# Patient Record
Sex: Female | Born: 1995 | Race: White | Hispanic: No | Marital: Single | Smoking: Never smoker
Health system: Southern US, Community
[De-identification: ages and names within clinical notes are randomized; demographics above are authoritative.]

## PROBLEM LIST (undated history)

## (undated) DIAGNOSIS — F99 Mental disorder, not otherwise specified: Secondary | ICD-10-CM

## (undated) DIAGNOSIS — F419 Anxiety disorder, unspecified: Secondary | ICD-10-CM

## (undated) DIAGNOSIS — I10 Essential (primary) hypertension: Secondary | ICD-10-CM

## (undated) HISTORY — DX: Mental disorder, not otherwise specified: F99

## (undated) HISTORY — DX: Essential (primary) hypertension: I10

## (undated) HISTORY — DX: Anxiety disorder, unspecified: F41.9

## (undated) HISTORY — PX: DENTAL SURGERY: SHX609

---

## 2017-05-27 ENCOUNTER — Encounter: Payer: Self-pay | Admitting: Adult Health

## 2017-05-27 ENCOUNTER — Ambulatory Visit (INDEPENDENT_AMBULATORY_CARE_PROVIDER_SITE_OTHER): Payer: 59 | Admitting: Adult Health

## 2017-05-27 VITALS — BP 120/60 | HR 98 | Ht 64.0 in | Wt 217.0 lb

## 2017-05-27 DIAGNOSIS — F419 Anxiety disorder, unspecified: Secondary | ICD-10-CM

## 2017-05-27 DIAGNOSIS — F418 Other specified anxiety disorders: Secondary | ICD-10-CM | POA: Diagnosis not present

## 2017-05-27 DIAGNOSIS — N926 Irregular menstruation, unspecified: Secondary | ICD-10-CM

## 2017-05-27 DIAGNOSIS — F329 Major depressive disorder, single episode, unspecified: Secondary | ICD-10-CM

## 2017-05-27 DIAGNOSIS — O3680X0 Pregnancy with inconclusive fetal viability, not applicable or unspecified: Secondary | ICD-10-CM | POA: Insufficient documentation

## 2017-05-27 DIAGNOSIS — Z3201 Encounter for pregnancy test, result positive: Secondary | ICD-10-CM | POA: Insufficient documentation

## 2017-05-27 DIAGNOSIS — R350 Frequency of micturition: Secondary | ICD-10-CM | POA: Diagnosis not present

## 2017-05-27 DIAGNOSIS — Z3A01 Less than 8 weeks gestation of pregnancy: Secondary | ICD-10-CM

## 2017-05-27 DIAGNOSIS — F32A Depression, unspecified: Secondary | ICD-10-CM | POA: Insufficient documentation

## 2017-05-27 LAB — POCT URINE PREGNANCY: PREG TEST UR: POSITIVE — AB

## 2017-05-27 MED ORDER — SERTRALINE HCL 100 MG PO TABS: ORAL_TABLET | ORAL | 6 refills | Status: DC

## 2017-05-27 MED ORDER — PRENATAL PLUS IRON 29-1 MG PO TABS: 1.0000 | ORAL_TABLET | Freq: Every day | ORAL | 12 refills | Status: DC

## 2017-05-27 NOTE — Patient Instructions (Signed)
First Trimester of Pregnancy The first trimester of pregnancy is from week 1 until the end of week 13 (months 1 through 3). A week after a sperm fertilizes an egg, the egg will implant on the wall of the uterus. This embryo will begin to develop into a baby. Genes from you and your partner will form the baby. The female genes will determine whether the baby will be a boy or a girl. At 6-8 weeks, the eyes and face will be formed, and the heartbeat can be seen on ultrasound. At the end of 12 weeks, all the baby's organs will be formed. Now that you are pregnant, you will want to do everything you can to have a healthy baby. Two of the most important things are to get good prenatal care and to follow your health care provider's instructions. Prenatal care is all the medical care you receive before the baby's birth. This care will help prevent, find, and treat any problems during the pregnancy and childbirth. Body changes during your first trimester Your body goes through many changes during pregnancy. The changes vary from woman to woman.  You may gain or lose a couple of pounds at first.  You may feel sick to your stomach (nauseous) and you may throw up (vomit). If the vomiting is uncontrollable, call your health care provider.  You may tire easily.  You may develop headaches that can be relieved by medicines. All medicines should be approved by your health care provider.  You may urinate more often. Painful urination may mean you have a bladder infection.  You may develop heartburn as a result of your pregnancy.  You may develop constipation because certain hormones are causing the muscles that push stool through your intestines to slow down.  You may develop hemorrhoids or swollen veins (varicose veins).  Your breasts may begin to grow larger and become tender. Your nipples may stick out more, and the tissue that surrounds them (areola) may become darker.  Your gums may bleed and may be  sensitive to brushing and flossing.  Dark spots or blotches (chloasma, mask of pregnancy) may develop on your face. This will likely fade after the baby is born.  Your menstrual periods will stop.  You may have a loss of appetite.  You may develop cravings for certain kinds of food.  You may have changes in your emotions from day to day, such as being excited to be pregnant or being concerned that something may go wrong with the pregnancy and baby.  You may have more vivid and strange dreams.  You may have changes in your hair. These can include thickening of your hair, rapid growth, and changes in texture. Some women also have hair loss during or after pregnancy, or hair that feels dry or thin. Your hair will most likely return to normal after your baby is born.  What to expect at prenatal visits During a routine prenatal visit:  You will be weighed to make sure you and the baby are growing normally.  Your blood pressure will be taken.  Your abdomen will be measured to track your baby's growth.  The fetal heartbeat will be listened to between weeks 10 and 14 of your pregnancy.  Test results from any previous visits will be discussed.  Your health care provider may ask you:  How you are feeling.  If you are feeling the baby move.  If you have had any abnormal symptoms, such as leaking fluid, bleeding, severe headaches,   or abdominal cramping.  If you are using any tobacco products, including cigarettes, chewing tobacco, and electronic cigarettes.  If you have any questions.  Other tests that may be performed during your first trimester include:  Blood tests to find your blood type and to check for the presence of any previous infections. The tests will also be used to check for low iron levels (anemia) and protein on red blood cells (Rh antibodies). Depending on your risk factors, or if you previously had diabetes during pregnancy, you may have tests to check for high blood  sugar that affects pregnant women (gestational diabetes).  Urine tests to check for infections, diabetes, or protein in the urine.  An ultrasound to confirm the proper growth and development of the baby.  Fetal screens for spinal cord problems (spina bifida) and Down syndrome.  HIV (human immunodeficiency virus) testing. Routine prenatal testing includes screening for HIV, unless you choose not to have this test.  You may need other tests to make sure you and the baby are doing well.  Follow these instructions at home: Medicines  Follow your health care provider's instructions regarding medicine use. Specific medicines may be either safe or unsafe to take during pregnancy.  Take a prenatal vitamin that contains at least 600 micrograms (mcg) of folic acid.  If you develop constipation, try taking a stool softener if your health care provider approves. Eating and drinking  Eat a balanced diet that includes fresh fruits and vegetables, whole grains, good sources of protein such as meat, eggs, or tofu, and low-fat dairy. Your health care provider will help you determine the amount of weight gain that is right for you.  Avoid raw meat and uncooked cheese. These carry germs that can cause birth defects in the baby.  Eating four or five small meals rather than three large meals a day may help relieve nausea and vomiting. If you start to feel nauseous, eating a few soda crackers can be helpful. Drinking liquids between meals, instead of during meals, also seems to help ease nausea and vomiting.  Limit foods that are high in fat and processed sugars, such as fried and sweet foods.  To prevent constipation: ? Eat foods that are high in fiber, such as fresh fruits and vegetables, whole grains, and beans. ? Drink enough fluid to keep your urine clear or pale yellow. Activity  Exercise only as directed by your health care provider. Most women can continue their usual exercise routine during  pregnancy. Try to exercise for 30 minutes at least 5 days a week. Exercising will help you: ? Control your weight. ? Stay in shape. ? Be prepared for labor and delivery.  Experiencing pain or cramping in the lower abdomen or lower back is a good sign that you should stop exercising. Check with your health care provider before continuing with normal exercises.  Try to avoid standing for long periods of time. Move your legs often if you must stand in one place for a long time.  Avoid heavy lifting.  Wear low-heeled shoes and practice good posture.  You may continue to have sex unless your health care provider tells you not to. Relieving pain and discomfort  Wear a good support bra to relieve breast tenderness.  Take warm sitz baths to soothe any pain or discomfort caused by hemorrhoids. Use hemorrhoid cream if your health care provider approves.  Rest with your legs elevated if you have leg cramps or low back pain.  If you develop   varicose veins in your legs, wear support hose. Elevate your feet for 15 minutes, 3-4 times a day. Limit salt in your diet. Prenatal care  Schedule your prenatal visits by the twelfth week of pregnancy. They are usually scheduled monthly at first, then more often in the last 2 months before delivery.  Write down your questions. Take them to your prenatal visits.  Keep all your prenatal visits as told by your health care provider. This is important. Safety  Wear your seat belt at all times when driving.  Make a list of emergency phone numbers, including numbers for family, friends, the hospital, and police and fire departments. General instructions  Ask your health care provider for a referral to a local prenatal education class. Begin classes no later than the beginning of month 6 of your pregnancy.  Ask for help if you have counseling or nutritional needs during pregnancy. Your health care provider can offer advice or refer you to specialists for help  with various needs.  Do not use hot tubs, steam rooms, or saunas.  Do not douche or use tampons or scented sanitary pads.  Do not cross your legs for long periods of time.  Avoid cat litter boxes and soil used by cats. These carry germs that can cause birth defects in the baby and possibly loss of the fetus by miscarriage or stillbirth.  Avoid all smoking, herbs, alcohol, and medicines not prescribed by your health care provider. Chemicals in these products affect the formation and growth of the baby.  Do not use any products that contain nicotine or tobacco, such as cigarettes and e-cigarettes. If you need help quitting, ask your health care provider. You may receive counseling support and other resources to help you quit.  Schedule a dentist appointment. At home, brush your teeth with a soft toothbrush and be gentle when you floss. Contact a health care provider if:  You have dizziness.  You have mild pelvic cramps, pelvic pressure, or nagging pain in the abdominal area.  You have persistent nausea, vomiting, or diarrhea.  You have a bad smelling vaginal discharge.  You have pain when you urinate.  You notice increased swelling in your face, hands, legs, or ankles.  You are exposed to fifth disease or chickenpox.  You are exposed to German measles (rubella) and have never had it. Get help right away if:  You have a fever.  You are leaking fluid from your vagina.  You have spotting or bleeding from your vagina.  You have severe abdominal cramping or pain.  You have rapid weight gain or loss.  You vomit blood or material that looks like coffee grounds.  You develop a severe headache.  You have shortness of breath.  You have any kind of trauma, such as from a fall or a car accident. Summary  The first trimester of pregnancy is from week 1 until the end of week 13 (months 1 through 3).  Your body goes through many changes during pregnancy. The changes vary from  woman to woman.  You will have routine prenatal visits. During those visits, your health care provider will examine you, discuss any test results you may have, and talk with you about how you are feeling. This information is not intended to replace advice given to you by your health care provider. Make sure you discuss any questions you have with your health care provider. Document Released: 01/14/2001 Document Revised: 01/02/2016 Document Reviewed: 01/02/2016 Elsevier Interactive Patient Education  2018 Elsevier   Inc.  

## 2017-05-27 NOTE — Progress Notes (Signed)
Subjective:     Patient ID: Sara Morse, female   DOB: 12/06/1995, 22 y.o.   MRN: [id number]  HPI Sara Morse is a 22 year old white female in for UPT, has missed a period and had +HPT.She has anxiety and depression and has been off meds for about a month.  PCP is [facility] in [place].   Review of Systems +missed period, +HPT +frequenct urination  +moody  Reviewed past medical,surgical, social and family history. Reviewed medications and allergies.     Objective:   Physical Exam BP 120/60 (BP Location: Left Arm, Patient Position: Sitting, Cuff Size: Small)   Pulse 98   Ht 5\' 4"  (1.626 m)   Wt 217 lb (98.4 kg)   LMP 04/16/2017   BMI 37.25 kg/m UPT +, about 5+6 weeks by LMP with EDD 01/21/18.Skin warm and dry. Neck: mid line trachea, normal thyroid, good ROM, no lymphadenopathy noted. Lungs: clear to ausculation bilaterally. Cardiovascular: regular rate and rhythm.Abdomen is soft and non tender.PHQ 9 score is 10, has been in zoloft but stopped about a month ago, will resume.  [facility] and about after hours call service.    Assessment:     1. Pregnancy test positive   2. Less than [redacted] weeks gestation of pregnancy   3. Encounter to determine fetal viability of pregnancy, single or unspecified fetus   4. Anxiety and depression       Plan:    Resume zoloft at 50 mg for now Meds ordered this encounter  Medications  . sertraline (ZOLOFT) 100 MG tablet    Sig: Take 1/2 tablet daily    Dispense:  15 tablet    Refill:  6    Order Specific Question:   Supervising Provider    Answer:   Despina HiddenEURE, LUTHER H [[id number]]  . Prenatal Vit-Iron Carbonyl-FA (PRENATAL PLUS IRON) 29-1 MG TABS    Sig: Take 1 tablet by mouth daily.    Dispense:  30 tablet    Refill:  12    Order Specific Question:   Supervising Provider    Answer:   Lazaro ArmsEURE, LUTHER H [[id number]]  Return in 1 week for dating US Review handouts on First trimester and by [facility]  Let boyfriend handle kitty litter  (has 3 cats)

## 2017-06-03 ENCOUNTER — Other Ambulatory Visit: Payer: 59

## 2017-06-03 ENCOUNTER — Ambulatory Visit (INDEPENDENT_AMBULATORY_CARE_PROVIDER_SITE_OTHER): Payer: 59

## 2017-06-03 ENCOUNTER — Other Ambulatory Visit: Payer: Self-pay | Admitting: Adult Health

## 2017-06-03 DIAGNOSIS — Z3A01 Less than 8 weeks gestation of pregnancy: Secondary | ICD-10-CM | POA: Diagnosis not present

## 2017-06-03 DIAGNOSIS — O3680X0 Pregnancy with inconclusive fetal viability, not applicable or unspecified: Secondary | ICD-10-CM | POA: Diagnosis not present

## 2017-06-03 DIAGNOSIS — Z3490 Encounter for supervision of normal pregnancy, unspecified, unspecified trimester: Secondary | ICD-10-CM

## 2017-06-03 NOTE — Progress Notes (Signed)
[place] TA/TV:5+4 wks GS,no fetal pole or YS,normal ovaries bilat,pt will have labs today and f/u ultrasound in 10 days,per Victorino Dike

## 2017-06-04 ENCOUNTER — Telehealth: Payer: Self-pay | Admitting: Adult Health

## 2017-06-04 LAB — BETA HCG QUANT (REF LAB): HCG QUANT: 22955 m[IU]/mL

## 2017-06-04 LAB — PROGESTERONE: Progesterone: 4.4 ng/mL

## 2017-06-04 MED ORDER — PROGESTERONE MICRONIZED 200 MG PO CAPS: ORAL_CAPSULE | ORAL | 3 refills | Status: DC

## 2017-06-04 NOTE — Telephone Encounter (Signed)
Pt aware of labs and that progesterone low, will supplement with 200 mg Prometrium at hs daily and keep [place] appt.

## 2017-06-07 ENCOUNTER — Encounter: Payer: Self-pay | Admitting: Adult Health

## 2017-06-15 ENCOUNTER — Other Ambulatory Visit: Payer: Self-pay | Admitting: Adult Health

## 2017-06-15 DIAGNOSIS — O3680X0 Pregnancy with inconclusive fetal viability, not applicable or unspecified: Secondary | ICD-10-CM

## 2017-06-16 ENCOUNTER — Other Ambulatory Visit: Payer: Self-pay | Admitting: Obstetrics and Gynecology

## 2017-06-16 ENCOUNTER — Ambulatory Visit (INDEPENDENT_AMBULATORY_CARE_PROVIDER_SITE_OTHER): Payer: 59 | Admitting: Obstetrics and Gynecology

## 2017-06-16 ENCOUNTER — Ambulatory Visit (INDEPENDENT_AMBULATORY_CARE_PROVIDER_SITE_OTHER): Payer: 59

## 2017-06-16 ENCOUNTER — Other Ambulatory Visit: Payer: Self-pay | Admitting: Adult Health

## 2017-06-16 DIAGNOSIS — Z3A08 8 weeks gestation of pregnancy: Secondary | ICD-10-CM | POA: Diagnosis not present

## 2017-06-16 DIAGNOSIS — O3680X0 Pregnancy with inconclusive fetal viability, not applicable or unspecified: Secondary | ICD-10-CM

## 2017-06-16 DIAGNOSIS — O021 Missed abortion: Secondary | ICD-10-CM | POA: Diagnosis not present

## 2017-06-16 NOTE — Progress Notes (Signed)
   Family [facility] Visit  @            Patient name: Sara Morse MRN [id number]  Date of birth: 1995/06/22  CC & HPI:  Sara Morse is a 22 y.o. female presenting today for follow-up of today's ultrasound which shows a missed AB she has a 1.8 cm gestational sac with a large area of subchorionic hemorrhage surrounding the sac on image 4.  There is a small hint of a yolk sac but no fetal pole.  She had a previous ultrasound 2 weeks ago which showed a small gestational sac.  There is been an appropriate growth to date.   ROS:  ROS No spotting or bleeding.  I reviewed the ultrasound photos with the patient and her husband Chasse in detail  Pertinent History Reviewed:   Reviewed: Significant for patient has no questions despite efforts to connect and communicate Medical         Past Medical History:  Diagnosis Date  . Anxiety   . Mental disorder                               Surgical Hx:    Past Surgical History:  Procedure Laterality Date  . DENTAL SURGERY     Medications: Reviewed & Updated - see associated section                       Current Outpatient Medications:  .  hydrOXYzine (ATARAX/VISTARIL) 25 MG tablet, Take 25 mg by mouth 3 (three) times daily as needed., Disp: , Rfl:  .  Prenatal Vit-Iron Carbonyl-FA (PRENATAL PLUS IRON) 29-1 MG TABS, Take 1 tablet by mouth daily., Disp: 30 tablet, Rfl: 12 .  progesterone (PROMETRIUM) 200 MG capsule, Place 1 in vagina daily at bedtime, Disp: 30 capsule, Rfl: 3 .  sertraline (ZOLOFT) 100 MG tablet, Take 1/2 tablet daily, Disp: 15 tablet, Rfl: 6   Social History: Reviewed -  reports that she has never smoked. She has never used smokeless tobacco.  Objective Findings:  Vitals: Last menstrual period 04/16/2017.  PHYSICAL EXAMINATION General appearance - alert, well appearing, and in no distress, oriented to person, place, and time and overweight Mental status - alert, oriented to person, place, and time, normal mood,  behavior, speech, dress, motor activity, and thought processes    Assessment & Plan:   A:  1. Missed AB  P:  1. Patient declines option of Cytotec at this point.  She simply wants to follow.  Will arrange follow-up visit in 2 weeks.  Appointment with nurse practitioner or nurse midwife to be made

## 2017-06-16 NOTE — Progress Notes (Signed)
US TV:6+2 wks GS w/ys,GS 15.8 mm,no fetal pole visualized,normal ovaries bilat,small subchorionic hemorrhage 7 x 9 x 3 mm,Dr Emelda Fear discussed results w/pt.

## 2017-06-17 ENCOUNTER — Other Ambulatory Visit: Payer: Self-pay | Admitting: Obstetrics and Gynecology

## 2017-06-17 ENCOUNTER — Telehealth: Payer: Self-pay | Admitting: Obstetrics and Gynecology

## 2017-06-17 LAB — ABO/RH: Rh Factor: POSITIVE

## 2017-06-17 MED ORDER — MISOPROSTOL 200 MCG PO TABS: 800.0000 ug | ORAL_TABLET | Freq: Once | ORAL | 1 refills | Status: DC

## 2017-06-17 NOTE — Telephone Encounter (Signed)
Patient informed cytotec was sent to pharmacy.  Instructed to take vaginally today and repeat in 48 hours if not bleeding or cramping.  Patient verbalized understanding.

## 2017-06-17 NOTE — Progress Notes (Signed)
Cytotec 800 mcg po Rx'd to pt with one refil for missed ab.

## 2017-06-17 NOTE — Telephone Encounter (Signed)
Patient called stating that she seen Dr. Emelda Fear yesterday and told her he could prescribe her something to hurry along the miscarriage. Pt states that she told him no yesterday but has changed her mind and would like for him to send it in to [facility] in [place]. Please contact pt

## 2017-06-22 ENCOUNTER — Ambulatory Visit: Payer: 59 | Admitting: Women's Health

## 2017-07-01 ENCOUNTER — Ambulatory Visit: Payer: 59 | Admitting: Women's Health

## 2017-07-13 ENCOUNTER — Ambulatory Visit: Payer: 59 | Admitting: Women's Health

## 2017-07-15 ENCOUNTER — Encounter: Payer: Self-pay | Admitting: Women's Health

## 2017-07-15 ENCOUNTER — Ambulatory Visit (INDEPENDENT_AMBULATORY_CARE_PROVIDER_SITE_OTHER): Payer: 59 | Admitting: Women's Health

## 2017-07-15 VITALS — BP 131/74 | HR 96 | Ht 64.0 in | Wt 221.0 lb

## 2017-07-15 DIAGNOSIS — Z319 Encounter for procreative management, unspecified: Secondary | ICD-10-CM

## 2017-07-15 DIAGNOSIS — Z3202 Encounter for pregnancy test, result negative: Secondary | ICD-10-CM

## 2017-07-15 DIAGNOSIS — O039 Complete or unspecified spontaneous abortion without complication: Secondary | ICD-10-CM | POA: Diagnosis not present

## 2017-07-15 DIAGNOSIS — O021 Missed abortion: Secondary | ICD-10-CM

## 2017-07-15 DIAGNOSIS — Z3169 Encounter for other general counseling and advice on procreation: Secondary | ICD-10-CM

## 2017-07-15 LAB — POCT URINE PREGNANCY: PREG TEST UR: NEGATIVE

## 2017-07-15 NOTE — Progress Notes (Signed)
   GYN VISIT Patient name: Sara Morse MRN [id number]  Date of birth: 09-Feb-1995 Chief Complaint:   Follow-up (Missed AB)  History of Present Illness:   Sara HuaSarah Comp is a 22 y.o. [id number] Caucasian female being seen today for f/u missed Ab dx 06/16/17. Took cytotec and passed sac w/in hours. Bleeding/cramping have completely resolved. HPT she took now neg. Does desire another pregnancy. Taking pnv. Has had unprotected sex since miscarriage.      Patient's last menstrual period was 04/16/2017. The current method of family planning is none. Last pap 2018 at PCP. Results were:  normal Review of Systems:   Pertinent items are noted in HPI Denies fever/chills, dizziness, headaches, visual disturbances, fatigue, shortness of breath, chest pain, abdominal pain, vomiting, abnormal vaginal discharge/itching/odor/irritation, problems with periods, bowel movements, urination, or intercourse unless otherwise stated above.  Pertinent History Reviewed:  Reviewed past medical,surgical, social, obstetrical and family history.  Reviewed problem list, medications and allergies. Physical Assessment:   Vitals:   07/15/17 1559  BP: 131/74  Pulse: 96  Weight: 221 lb (100.2 kg)  Height: 5\' 4"  (1.626 m)  Body mass index is 37.93 kg/m.       Physical Examination:   General appearance: alert, well appearing, and in no distress  Mental status: alert, oriented to person, place, and time  Skin: warm & dry   Cardiovascular: normal heart rate noted  Respiratory: normal respiratory effort, no distress  Abdomen: soft, non-tender   Pelvic: examination not indicated  Extremities: no edema   Results for orders placed or performed in visit on 07/15/17 (from the past 24 hour(s))  POCT urine pregnancy   Collection Time: 07/15/17  4:08 PM  Result Value Ref Range   Preg Test, Ur Negative Negative    Assessment & Plan:  1) Completed miscarriage> will check hcg today  2) Desires pregnancy> continue pnv, let us  know when pregnant  Meds: No orders of the defined types were placed in this encounter.   Orders Placed This Encounter  Procedures  . Beta hCG quant (ref lab)  . POCT urine pregnancy    Return for prn.  Cheral MarkerKimberly R Ren Aspinall CNM, [facility]NP-BC 07/15/2017 4:22 PM

## 2017-07-16 ENCOUNTER — Other Ambulatory Visit: Payer: Self-pay | Admitting: Women's Health

## 2017-07-16 LAB — BETA HCG QUANT (REF LAB): hCG Quant: 6 m[IU]/mL

## 2018-01-13 ENCOUNTER — Ambulatory Visit: Payer: 59 | Admitting: Adult Health

## 2018-02-01 ENCOUNTER — Encounter: Payer: Self-pay | Admitting: Adult Health

## 2018-02-01 ENCOUNTER — Ambulatory Visit (INDEPENDENT_AMBULATORY_CARE_PROVIDER_SITE_OTHER): Payer: 59 | Admitting: Adult Health

## 2018-02-01 ENCOUNTER — Other Ambulatory Visit: Payer: Self-pay

## 2018-02-01 VITALS — BP 149/87 | HR 113 | Ht 64.0 in | Wt 228.0 lb

## 2018-02-01 DIAGNOSIS — N926 Irregular menstruation, unspecified: Secondary | ICD-10-CM | POA: Diagnosis not present

## 2018-02-01 DIAGNOSIS — Z3A09 9 weeks gestation of pregnancy: Secondary | ICD-10-CM

## 2018-02-01 DIAGNOSIS — Z3201 Encounter for pregnancy test, result positive: Secondary | ICD-10-CM | POA: Diagnosis not present

## 2018-02-01 DIAGNOSIS — R112 Nausea with vomiting, unspecified: Secondary | ICD-10-CM | POA: Diagnosis not present

## 2018-02-01 DIAGNOSIS — O3680X0 Pregnancy with inconclusive fetal viability, not applicable or unspecified: Secondary | ICD-10-CM | POA: Insufficient documentation

## 2018-02-01 DIAGNOSIS — O09291 Supervision of pregnancy with other poor reproductive or obstetric history, first trimester: Secondary | ICD-10-CM

## 2018-02-01 LAB — POCT URINE PREGNANCY: PREG TEST UR: POSITIVE — AB

## 2018-02-01 NOTE — Patient Instructions (Signed)
First Trimester of Pregnancy  The first trimester of pregnancy is from week 1 until the end of week 13 (months 1 through 3). A week after a sperm fertilizes an egg, the egg will implant on the wall of the uterus. This embryo will begin to develop into a baby. Genes from you and your partner will form the baby. The female genes will determine whether the baby will be a boy or a girl. At 6-8 weeks, the eyes and face will be formed, and the heartbeat can be seen on ultrasound. At the end of 12 weeks, all the baby's organs will be formed.  Now that you are pregnant, you will want to do everything you can to have a healthy baby. Two of the most important things are to get good prenatal care and to follow your health care provider's instructions. Prenatal care is all the medical care you receive before the baby's birth. This care will help prevent, find, and treat any problems during the pregnancy and childbirth.  Body changes during your first trimester  Your body goes through many changes during pregnancy. The changes vary from woman to woman.   You may gain or lose a couple of pounds at first.   You may feel sick to your stomach (nauseous) and you may throw up (vomit). If the vomiting is uncontrollable, call your health care provider.   You may tire easily.   You may develop headaches that can be relieved by medicines. All medicines should be approved by your health care provider.   You may urinate more often. Painful urination may mean you have a bladder infection.   You may develop heartburn as a result of your pregnancy.   You may develop constipation because certain hormones are causing the muscles that push stool through your intestines to slow down.   You may develop hemorrhoids or swollen veins (varicose veins).   Your breasts may begin to grow larger and become tender. Your nipples may stick out more, and the tissue that surrounds them (areola) may become darker.   Your gums may bleed and may be  sensitive to brushing and flossing.   Dark spots or blotches (chloasma, mask of pregnancy) may develop on your face. This will likely fade after the baby is born.   Your menstrual periods will stop.   You may have a loss of appetite.   You may develop cravings for certain kinds of food.   You may have changes in your emotions from day to day, such as being excited to be pregnant or being concerned that something may go wrong with the pregnancy and baby.   You may have more vivid and strange dreams.   You may have changes in your hair. These can include thickening of your hair, rapid growth, and changes in texture. Some women also have hair loss during or after pregnancy, or hair that feels dry or thin. Your hair will most likely return to normal after your baby is born.  What to expect at prenatal visits  During a routine prenatal visit:   You will be weighed to make sure you and the baby are growing normally.   Your blood pressure will be taken.   Your abdomen will be measured to track your baby's growth.   The fetal heartbeat will be listened to between weeks 10 and 14 of your pregnancy.   Test results from any previous visits will be discussed.  Your health care provider may ask you:     How you are feeling.   If you are feeling the baby move.   If you have had any abnormal symptoms, such as leaking fluid, bleeding, severe headaches, or abdominal cramping.   If you are using any tobacco products, including cigarettes, chewing tobacco, and electronic cigarettes.   If you have any questions.  Other tests that may be performed during your first trimester include:   Blood tests to find your blood type and to check for the presence of any previous infections. The tests will also be used to check for low iron levels (anemia) and protein on red blood cells (Rh antibodies). Depending on your risk factors, or if you previously had diabetes during pregnancy, you may have tests to check for high blood sugar  that affects pregnant women (gestational diabetes).   Urine tests to check for infections, diabetes, or protein in the urine.   An ultrasound to confirm the proper growth and development of the baby.   Fetal screens for spinal cord problems (spina bifida) and Down syndrome.   HIV (human immunodeficiency virus) testing. Routine prenatal testing includes screening for HIV, unless you choose not to have this test.   You may need other tests to make sure you and the baby are doing well.  Follow these instructions at home:  Medicines   Follow your health care provider's instructions regarding medicine use. Specific medicines may be either safe or unsafe to take during pregnancy.   Take a prenatal vitamin that contains at least 600 micrograms (mcg) of folic acid.   If you develop constipation, try taking a stool softener if your health care provider approves.  Eating and drinking     Eat a balanced diet that includes fresh fruits and vegetables, whole grains, good sources of protein such as meat, eggs, or tofu, and low-fat dairy. Your health care provider will help you determine the amount of weight gain that is right for you.   Avoid raw meat and uncooked cheese. These carry germs that can cause birth defects in the baby.   Eating four or five small meals rather than three large meals a day may help relieve nausea and vomiting. If you start to feel nauseous, eating a few soda crackers can be helpful. Drinking liquids between meals, instead of during meals, also seems to help ease nausea and vomiting.   Limit foods that are high in fat and processed sugars, such as fried and sweet foods.   To prevent constipation:  ? Eat foods that are high in fiber, such as fresh fruits and vegetables, whole grains, and beans.  ? Drink enough fluid to keep your urine clear or pale yellow.  Activity   Exercise only as directed by your health care provider. Most women can continue their usual exercise routine during  pregnancy. Try to exercise for 30 minutes at least 5 days a week. Exercising will help you:  ? Control your weight.  ? Stay in shape.  ? Be prepared for labor and delivery.   Experiencing pain or cramping in the lower abdomen or lower back is a good sign that you should stop exercising. Check with your health care provider before continuing with normal exercises.   Try to avoid standing for long periods of time. Move your legs often if you must stand in one place for a long time.   Avoid heavy lifting.   Wear low-heeled shoes and practice good posture.   You may continue to have sex unless your health care   provider tells you not to.  Relieving pain and discomfort   Wear a good support bra to relieve breast tenderness.   Take warm sitz baths to soothe any pain or discomfort caused by hemorrhoids. Use hemorrhoid cream if your health care provider approves.   Rest with your legs elevated if you have leg cramps or low back pain.   If you develop varicose veins in your legs, wear support hose. Elevate your feet for 15 minutes, 3-4 times a day. Limit salt in your diet.  Prenatal care   Schedule your prenatal visits by the twelfth week of pregnancy. They are usually scheduled monthly at first, then more often in the last 2 months before delivery.   Write down your questions. Take them to your prenatal visits.   Keep all your prenatal visits as told by your health care provider. This is important.  Safety   Wear your seat belt at all times when driving.   Make a list of emergency phone numbers, including numbers for family, friends, the hospital, and police and fire departments.  General instructions   Ask your health care provider for a referral to a local prenatal education class. Begin classes no later than the beginning of month 6 of your pregnancy.   Ask for help if you have counseling or nutritional needs during pregnancy. Your health care provider can offer advice or refer you to specialists for help  with various needs.   Do not use hot tubs, steam rooms, or saunas.   Do not douche or use tampons or scented sanitary pads.   Do not cross your legs for long periods of time.   Avoid cat litter boxes and soil used by cats. These carry germs that can cause birth defects in the baby and possibly loss of the fetus by miscarriage or stillbirth.   Avoid all smoking, herbs, alcohol, and medicines not prescribed by your health care provider. Chemicals in these products affect the formation and growth of the baby.   Do not use any products that contain nicotine or tobacco, such as cigarettes and e-cigarettes. If you need help quitting, ask your health care provider. You may receive counseling support and other resources to help you quit.   Schedule a dentist appointment. At home, brush your teeth with a soft toothbrush and be gentle when you floss.  Contact a health care provider if:   You have dizziness.   You have mild pelvic cramps, pelvic pressure, or nagging pain in the abdominal area.   You have persistent nausea, vomiting, or diarrhea.   You have a bad smelling vaginal discharge.   You have pain when you urinate.   You notice increased swelling in your face, hands, legs, or ankles.   You are exposed to fifth disease or chickenpox.   You are exposed to German measles (rubella) and have never had it.  Get help right away if:   You have a fever.   You are leaking fluid from your vagina.   You have spotting or bleeding from your vagina.   You have severe abdominal cramping or pain.   You have rapid weight gain or loss.   You vomit blood or material that looks like coffee grounds.   You develop a severe headache.   You have shortness of breath.   You have any kind of trauma, such as from a fall or a car accident.  Summary   The first trimester of pregnancy is from week 1 until   the end of week 13 (months 1 through 3).   Your body goes through many changes during pregnancy. The changes vary from  woman to woman.   You will have routine prenatal visits. During those visits, your health care provider will examine you, discuss any test results you may have, and talk with you about how you are feeling.  This information is not intended to replace advice given to you by your health care provider. Make sure you discuss any questions you have with your health care provider.  Document Released: 01/14/2001 Document Revised: 01/02/2016 Document Reviewed: 01/02/2016  [employer]vier Interactive Patient Education  2019 [employer]

## 2018-02-01 NOTE — Progress Notes (Signed)
Patient ID: Sara Morse, female   DOB: 11-04-1995, 22 y.o.   MRN: [id number] History of Present Illness: [name] is a 22 year old white female in for UPT, has missed a period and has nausea and vomiting.She had miscarriage in the summer, and had low progesterone level.   Current Medications, Allergies, Past Medical History, Past Surgical History, Family History and Social History were reviewed in [facility] electronic medical record.     Review of Systems: +missed period  +nausea and vomiting   Physical Exam:BP (!) 149/87 (BP Location: Left Arm, Patient Position: Sitting, Cuff Size: Normal)   Pulse (!) 113   Ht 5\' 4"  (1.626 m)   Wt 228 lb (103.4 kg)   LMP 11/25/2017   Breastfeeding Unknown   BMI 39.14 kg/m  General:  Well developed, well nourished, no acute distress Skin:  Warm and dry Neck:  Midline trachea, normal thyroid, good ROM, no lymphadenopathy Lungs; Clear to auscultation bilaterally Cardiovascular: Regular rate and rhythm Abdomen:  Soft, non tender Psych:  No mood changes, alert and cooperative,seems happy Fall risk is low. Get in back for US tomorrow and check progesterone today, continue PNV.   Impression: 1. Positive pregnancy test   2. [redacted] weeks gestation of pregnancy   3. Encounter to determine fetal viability of pregnancy, single or unspecified fetus   4. History of miscarriage, currently pregnant, first trimester       Plan: Check progesterone level Eat often Return in 1 day for dating US and about 2 weeks for new OB  Review handouts on First trimester and by [facility]

## 2018-02-02 ENCOUNTER — Telehealth: Payer: Self-pay | Admitting: Adult Health

## 2018-02-02 ENCOUNTER — Ambulatory Visit (INDEPENDENT_AMBULATORY_CARE_PROVIDER_SITE_OTHER): Payer: 59

## 2018-02-02 DIAGNOSIS — O3680X0 Pregnancy with inconclusive fetal viability, not applicable or unspecified: Secondary | ICD-10-CM

## 2018-02-02 LAB — PROGESTERONE: PROGESTERONE: 7.6 ng/mL

## 2018-02-02 MED ORDER — PROGESTERONE MICRONIZED 200 MG PO CAPS: ORAL_CAPSULE | ORAL | 3 refills | Status: DC

## 2018-02-02 NOTE — Telephone Encounter (Signed)
Progesterone 7.6 Rx prometrium 200 mg in vagina at HS

## 2018-02-02 NOTE — Progress Notes (Signed)
US 7+3 wks,single IUP w/ys,positive fht 153 bpm,crl 12.33 mm,normal left ovary,simple right corpus luteal cyst 2.3 x 2.1 x 1.6 cm

## 2018-02-16 ENCOUNTER — Ambulatory Visit (INDEPENDENT_AMBULATORY_CARE_PROVIDER_SITE_OTHER): Payer: 59 | Admitting: Women's Health

## 2018-02-16 ENCOUNTER — Ambulatory Visit: Payer: 59 | Admitting: *Deleted

## 2018-02-16 ENCOUNTER — Encounter: Payer: Self-pay | Admitting: Women's Health

## 2018-02-16 ENCOUNTER — Other Ambulatory Visit: Payer: Self-pay

## 2018-02-16 VITALS — BP 144/77 | Wt 228.0 lb

## 2018-02-16 DIAGNOSIS — O10919 Unspecified pre-existing hypertension complicating pregnancy, unspecified trimester: Secondary | ICD-10-CM

## 2018-02-16 DIAGNOSIS — Z3481 Encounter for supervision of other normal pregnancy, first trimester: Secondary | ICD-10-CM

## 2018-02-16 DIAGNOSIS — Z1389 Encounter for screening for other disorder: Secondary | ICD-10-CM

## 2018-02-16 DIAGNOSIS — F419 Anxiety disorder, unspecified: Secondary | ICD-10-CM

## 2018-02-16 DIAGNOSIS — F329 Major depressive disorder, single episode, unspecified: Secondary | ICD-10-CM

## 2018-02-16 DIAGNOSIS — Z3A09 9 weeks gestation of pregnancy: Secondary | ICD-10-CM

## 2018-02-16 DIAGNOSIS — O0990 Supervision of high risk pregnancy, unspecified, unspecified trimester: Secondary | ICD-10-CM | POA: Insufficient documentation

## 2018-02-16 DIAGNOSIS — O0991 Supervision of high risk pregnancy, unspecified, first trimester: Secondary | ICD-10-CM | POA: Diagnosis not present

## 2018-02-16 DIAGNOSIS — Z331 Pregnant state, incidental: Secondary | ICD-10-CM

## 2018-02-16 LAB — POCT URINALYSIS DIPSTICK OB
GLUCOSE, UA: NEGATIVE
KETONES UA: NEGATIVE
Leukocytes, UA: NEGATIVE
Nitrite, UA: NEGATIVE
POC,PROTEIN,UA: NEGATIVE
RBC UA: NEGATIVE

## 2018-02-16 MED ORDER — DOXYLAMINE-PYRIDOXINE ER 20-20 MG PO TBCR: 1.0000 | EXTENDED_RELEASE_TABLET | Freq: Every day | ORAL | 8 refills | Status: DC

## 2018-02-16 NOTE — Progress Notes (Signed)
INITIAL OBSTETRICAL VISIT Patient name: Sara Morse MRN [id number]  Date of birth: 1995/07/20 Chief Complaint:   Initial Prenatal Visit (nausea)  History of Present Illness:   Sara Morse is a 23 y.o. G64P0010 Caucasian female at [redacted]w[redacted]d by 7wk u/s, with an Estimated Date of Delivery: 09/18/18 being seen today for her initial obstetrical visit.   Her obstetrical history is significant for SAB 2019.   Today she reports some nausea- requests meds. No h/o HTN, bp elevated today and on 12/30 visit. Taking prometrium nightly for low progesterone.  Patient's last menstrual period was 11/25/2017. Last pap 2018 w/ PCP. Results were: normal Review of Systems:   Pertinent items are noted in HPI Denies cramping/contractions, leakage of fluid, vaginal bleeding, abnormal vaginal discharge w/ itching/odor/irritation, headaches, visual changes, shortness of breath, chest pain, abdominal pain, severe nausea/vomiting, or problems with urination or bowel movements unless otherwise stated above.  Pertinent History Reviewed:  Reviewed past medical,surgical, social, obstetrical and family history.  Reviewed problem list, medications and allergies. OB History  Gravida Para Term Preterm AB Living  2 0     1    SAB TAB Ectopic Multiple Live Births               # Outcome Date GA Lbr Len/2nd Weight Sex Delivery Anes PTL Lv  2 Current           1 AB 2019           Physical Assessment:   Vitals:   02/16/18 1452 02/16/18 1503  BP: (!) 147/77 (!) 144/77  Weight: 228 lb (103.4 kg)   Body mass index is 39.14 kg/m.       Physical Examination:  General appearance - well appearing, and in no distress  Mental status - alert, oriented to person, place, and time  Psych:  She has a normal mood and affect  Skin - warm and dry, normal color, no suspicious lesions noted  Chest - effort normal, all lung fields clear to auscultation bilaterally  Heart - normal rate and regular rhythm  Abdomen - soft,  nontender  Extremities:  No swelling or varicosities noted  Thin prep pap is not done   Fetal Heart Rate (bpm): +u/s via informal transabdominal u/s, +active fetus  Results for orders placed or performed in visit on 02/16/18 (from the past 24 hour(s))  POC Urinalysis Dipstick OB   Collection Time: 02/16/18  3:17 PM  Result Value Ref Range   Color, UA     Clarity, UA     Glucose, UA Negative Negative   Bilirubin, UA     Ketones, UA neg    Spec Grav, UA     Blood, UA neg    pH, UA     POC,PROTEIN,UA Negative Negative, Trace, Small (1+), Moderate (2+), Large (3+), 4+   Urobilinogen, UA     Nitrite, UA neg    Leukocytes, UA Negative Negative   Appearance     Odor      Assessment & Plan:  1) High-Risk Pregnancy G2P0010 at [redacted]w[redacted]d with an Estimated Date of Delivery: 09/18/18   2) Initial OB visit  3) CHTN> dx today, bp also elevated at 12/30 visit, will get baseline labs, start ASA 162mg  @ 12wks. Check bp at home QID, bring log  4) Low progesterone> continue prometrium qhs until 14wks  5) Dep/anx> continue zoloft  6) Nausea> rx bon[facility] to [facility] [place]  Meds:  Meds ordered this encounter  Medications  . Doxylamine-Pyridoxine  ER (BONJESTA) 20-20 MG TBCR    Sig: Take 1 tablet by mouth at bedtime. Can add 1 tablet in the morning if needed for nausea and vomiting    Dispense:  60 tablet    Refill:  8    Order Specific Question:   Supervising Provider    Answer:   Duane LopeEURE, LUTHER H [[id number]]    Initial labs obtained, including CMP, 24hr urine Continue prenatal vitamins Reviewed n/v relief measures and warning s/s to report Reviewed recommended weight gain based on pre-gravid BMI Encouraged well-balanced diet Genetic Screening discussed Integrated Screen: declined Cystic fibrosis screening discussed declined Ultrasound discussed; fetal survey: requested CCNC completed>PCM not here  Follow-up: Return in about 4 weeks (around 03/16/2018) for HROB.   Orders Placed This Encounter   Procedures  . GC/Chlamydia Probe Amp  . Urine Culture  . Obstetric Panel, Including HIV  . Sickle cell screen  . Urinalysis, Routine w reflex microscopic  . Pain Management Screening Profile (10S)  . Comprehensive metabolic panel  . Protein, urine, 24 hour  . POC Urinalysis Dipstick OB    Cheral MarkerKimberly R Kainat Pizana CNM, [employer] LLCWHNP-BC 02/16/2018 3:49 PM

## 2018-02-16 NOTE — Patient Instructions (Addendum)
Sara HuaSarah Jahnke, I greatly value your feedback.  If you receive a survey following your visit with us today, we appreciate you taking the time to fill it out.  Thanks, Joellyn HaffKim Filipe Greathouse, CNM, WHNP-BC  Begin taking 162mg  (two 81mg  tablets) baby aspirin daily at 12 weeks of pregnancy (2/1) to decrease risk of preeclampsia during pregnancy    Check your blood pressure 4 times daily and keep a log, bring this with you to all appointments.  If blood pressure is >=160 on top or >=110 on bottom, check again in 5 minutes, if still this high, call us or go to [facility] to be evaluated    Nausea & Vomiting  Have saltine crackers or pretzels by your bed and eat a few bites before you raise your head out of bed in the morning  Eat small frequent meals throughout the day instead of large meals  Drink plenty of fluids throughout the day to stay hydrated, just don't drink a lot of fluids with your meals.  This can make your stomach fill up faster making you feel sick  Do not brush your teeth right after you eat  Products with real ginger are good for nausea, like ginger ale and ginger hard candy Make sure it says made with real ginger!  Sucking on sour candy like lemon heads is also good for nausea  If your prenatal vitamins make you nauseated, take them at night so you will sleep through the nausea  Sea Bands  If you feel like you need medicine for the nausea & vomiting please let us know  If you are unable to keep any fluids or food down please let us know   Constipation  Drink plenty of fluid, preferably water, throughout the day  Eat foods high in fiber such as fruits, vegetables, and grains  Exercise, such as walking, is a good way to keep your bowels regular  Drink warm fluids, especially warm prune juice, or decaf coffee  Eat a 1/2 cup of real oatmeal (not instant), 1/2 cup applesauce, and 1/2-1 cup warm prune juice every day  If needed, you may take Colace (docusate sodium) stool  softener once or twice a day to help keep the stool soft. If you are pregnant, wait until you are out of your first trimester (12-14 weeks of pregnancy)  If you still are having problems with constipation, you may take Miralax once daily as needed to help keep your bowels regular.  If you are pregnant, wait until you are out of your first trimester (12-14 weeks of pregnancy)   First Trimester of Pregnancy The first trimester of pregnancy is from week 1 until the end of week 12 (months 1 through 3). A week after a sperm fertilizes an egg, the egg will implant on the wall of the uterus. This embryo will begin to develop into a baby. Genes from you and your partner are forming the baby. The female genes determine whether the baby is a boy or a girl. At 6-8 weeks, the eyes and face are formed, and the heartbeat can be seen on ultrasound. At the end of 12 weeks, all the baby's organs are formed.  Now that you are pregnant, you will want to do everything you can to have a healthy baby. Two of the most important things are to get good prenatal care and to follow your health care provider's instructions. Prenatal care is all the medical care you receive before the baby's birth. This care will  help prevent, find, and treat any problems during the pregnancy and childbirth. BODY CHANGES Your body goes through many changes during pregnancy. The changes vary from woman to woman.   You may gain or lose a couple of pounds at first.  You may feel sick to your stomach (nauseous) and throw up (vomit). If the vomiting is uncontrollable, call your health care provider.  You may tire easily.  You may develop headaches that can be relieved by medicines approved by your health care provider.  You may urinate more often. Painful urination may mean you have a bladder infection.  You may develop heartburn as a result of your pregnancy.  You may develop constipation because certain hormones are causing the muscles that  push waste through your intestines to slow down.  You may develop hemorrhoids or swollen, bulging veins (varicose veins).  Your breasts may begin to grow larger and become tender. Your nipples may stick out more, and the tissue that surrounds them (areola) may become darker.  Your gums may bleed and may be sensitive to brushing and flossing.  Dark spots or blotches (chloasma, mask of pregnancy) may develop on your face. This will likely fade after the baby is born.  Your menstrual periods will stop.  You may have a loss of appetite.  You may develop cravings for certain kinds of food.  You may have changes in your emotions from day to day, such as being excited to be pregnant or being concerned that something may go wrong with the pregnancy and baby.  You may have more vivid and strange dreams.  You may have changes in your hair. These can include thickening of your hair, rapid growth, and changes in texture. Some women also have hair loss during or after pregnancy, or hair that feels dry or thin. Your hair will most likely return to normal after your baby is born. WHAT TO EXPECT AT YOUR PRENATAL VISITS During a routine prenatal visit:  You will be weighed to make sure you and the baby are growing normally.  Your blood pressure will be taken.  Your abdomen will be measured to track your baby's growth.  The fetal heartbeat will be listened to starting around week 10 or 12 of your pregnancy.  Test results from any previous visits will be discussed. Your health care provider may ask you:  How you are feeling.  If you are feeling the baby move.  If you have had any abnormal symptoms, such as leaking fluid, bleeding, severe headaches, or abdominal cramping.  If you have any questions. Other tests that may be performed during your first trimester include:  Blood tests to find your blood type and to check for the presence of any previous infections. They will also be used to  check for low iron levels (anemia) and Rh antibodies. Later in the pregnancy, blood tests for diabetes will be done along with other tests if problems develop.  Urine tests to check for infections, diabetes, or protein in the urine.  An ultrasound to confirm the proper growth and development of the baby.  An amniocentesis to check for possible genetic problems.  Fetal screens for spina bifida and Down syndrome.  You may need other tests to make sure you and the baby are doing well. HOME CARE INSTRUCTIONS  Medicines  Follow your health care provider's instructions regarding medicine use. Specific medicines may be either safe or unsafe to take during pregnancy.  Take your prenatal vitamins as directed.  If  you develop constipation, try taking a stool softener if your health care provider approves. Diet  Eat regular, well-balanced meals. Choose a variety of foods, such as meat or vegetable-based protein, fish, milk and low-fat dairy products, vegetables, fruits, and whole grain breads and cereals. Your health care provider will help you determine the amount of weight gain that is right for you.  Avoid raw meat and uncooked cheese. These carry germs that can cause birth defects in the baby.  Eating four or five small meals rather than three large meals a day may help relieve nausea and vomiting. If you start to feel nauseous, eating a few soda crackers can be helpful. Drinking liquids between meals instead of during meals also seems to help nausea and vomiting.  If you develop constipation, eat more high-fiber foods, such as fresh vegetables or fruit and whole grains. Drink enough fluids to keep your urine clear or pale yellow. Activity and Exercise  Exercise only as directed by your health care provider. Exercising will help you:  Control your weight.  Stay in shape.  Be prepared for labor and delivery.  Experiencing pain or cramping in the lower abdomen or low back is a good sign  that you should stop exercising. Check with your health care provider before continuing normal exercises.  Try to avoid standing for long periods of time. Move your legs often if you must stand in one place for a long time.  Avoid heavy lifting.  Wear low-heeled shoes, and practice good posture.  You may continue to have sex unless your health care provider directs you otherwise. Relief of Pain or Discomfort  Wear a good support bra for breast tenderness.   Take warm sitz baths to soothe any pain or discomfort caused by hemorrhoids. Use hemorrhoid cream if your health care provider approves.   Rest with your legs elevated if you have leg cramps or low back pain.  If you develop varicose veins in your legs, wear support hose. Elevate your feet for 15 minutes, 3-4 times a day. Limit salt in your diet. Prenatal Care  Schedule your prenatal visits by the twelfth week of pregnancy. They are usually scheduled monthly at first, then more often in the last 2 months before delivery.  Write down your questions. Take them to your prenatal visits.  Keep all your prenatal visits as directed by your health care provider. Safety  Wear your seat belt at all times when driving.  Make a list of emergency phone numbers, including numbers for family, friends, the hospital, and police and fire departments. General Tips  Ask your health care provider for a referral to a local prenatal education class. Begin classes no later than at the beginning of month 6 of your pregnancy.  Ask for help if you have counseling or nutritional needs during pregnancy. Your health care provider can offer advice or refer you to specialists for help with various needs.  Do not use hot tubs, steam rooms, or saunas.  Do not douche or use tampons or scented sanitary pads.  Do not cross your legs for long periods of time.  Avoid cat litter boxes and soil used by cats. These carry germs that can cause birth defects in  the baby and possibly loss of the fetus by miscarriage or stillbirth.  Avoid all smoking, herbs, alcohol, and medicines not prescribed by your health care provider. Chemicals in these affect the formation and growth of the baby.  Schedule a dentist appointment. At home, brush  your teeth with a soft toothbrush and be gentle when you floss. SEEK MEDICAL CARE IF:   You have dizziness.  You have mild pelvic cramps, pelvic pressure, or nagging pain in the abdominal area.  You have persistent nausea, vomiting, or diarrhea.  You have a bad smelling vaginal discharge.  You have pain with urination.  You notice increased swelling in your face, hands, legs, or ankles. SEEK IMMEDIATE MEDICAL CARE IF:   You have a fever.  You are leaking fluid from your vagina.  You have spotting or bleeding from your vagina.  You have severe abdominal cramping or pain.  You have rapid weight gain or loss.  You vomit blood or material that looks like coffee grounds.  You are exposed to [place] measles and have never had them.  You are exposed to fifth disease or chickenpox.  You develop a severe headache.  You have shortness of breath.  You have any kind of trauma, such as from a fall or a car accident. Document Released: 01/14/2001 Document Revised: 06/06/2013 Document Reviewed: 11/30/2012 [facility] Patient Information 2015 [place], [place]. This information is not intended to replace advice given to you by your health care provider. Make sure you discuss any questions you have with your health care provider.

## 2018-02-17 LAB — PMP SCREEN PROFILE (10S), URINE
Amphetamine Scrn, Ur: NEGATIVE ng/mL
BARBITURATE SCREEN URINE: NEGATIVE ng/mL
BENZODIAZEPINE SCREEN, URINE: NEGATIVE ng/mL
CANNABINOIDS UR QL SCN: NEGATIVE ng/mL
COCAINE(METAB.)SCREEN, URINE: NEGATIVE ng/mL
Creatinine(Crt), U: 175.6 mg/dL (ref 20.0–300.0)
Methadone Screen, Urine: NEGATIVE ng/mL
OXYCODONE+OXYMORPHONE UR QL SCN: NEGATIVE ng/mL
Opiate Scrn, Ur: NEGATIVE ng/mL
Ph of Urine: 5.7 (ref 4.5–8.9)
Phencyclidine Qn, Ur: NEGATIVE ng/mL
Propoxyphene Scrn, Ur: NEGATIVE ng/mL

## 2018-02-17 LAB — OBSTETRIC PANEL, INCLUDING HIV
ANTIBODY SCREEN: NEGATIVE
BASOS: 1 %
Basophils Absolute: 0.1 10*3/uL (ref 0.0–0.2)
EOS (ABSOLUTE): 0.1 10*3/uL (ref 0.0–0.4)
EOS: 1 %
HEMATOCRIT: 38.4 % (ref 34.0–46.6)
HEMOGLOBIN: 13.3 g/dL (ref 11.1–15.9)
HIV SCREEN 4TH GENERATION: NONREACTIVE
Hepatitis B Surface Ag: NEGATIVE
IMMATURE GRANS (ABS): 0 10*3/uL (ref 0.0–0.1)
Immature Granulocytes: 0 %
LYMPHS: 21 %
Lymphocytes Absolute: 2.3 10*3/uL (ref 0.7–3.1)
MCH: 29.6 pg (ref 26.6–33.0)
MCHC: 34.6 g/dL (ref 31.5–35.7)
MCV: 86 fL (ref 79–97)
MONOCYTES: 6 %
Monocytes Absolute: 0.7 10*3/uL (ref 0.1–0.9)
NEUTROS ABS: 7.6 10*3/uL — AB (ref 1.4–7.0)
Neutrophils: 71 %
Platelets: 374 10*3/uL (ref 150–450)
RBC: 4.49 x10E6/uL (ref 3.77–5.28)
RDW: 12.8 % (ref 11.7–15.4)
RH TYPE: POSITIVE
RPR Ser Ql: NONREACTIVE
RUBELLA: 1.55 {index} (ref >0.99)
WBC: 10.8 10*3/uL (ref 3.4–10.8)

## 2018-02-17 LAB — COMPREHENSIVE METABOLIC PANEL
A/G RATIO: 1.8 (ref 1.2–2.2)
ALBUMIN: 4.4 g/dL (ref 3.5–5.5)
ALT: 22 IU/L (ref 0–32)
AST: 20 IU/L (ref 0–40)
Alkaline Phosphatase: 71 IU/L (ref 39–117)
BUN/Creatinine Ratio: 10 (ref 9–23)
BUN: 5 mg/dL — ABNORMAL LOW (ref 6–20)
Bilirubin Total: 0.2 mg/dL (ref 0.0–1.2)
CO2: 19 mmol/L — ABNORMAL LOW (ref 20–29)
Calcium: 9.3 mg/dL (ref 8.7–10.2)
Chloride: 105 mmol/L (ref 96–106)
Creatinine, Ser: 0.51 mg/dL — ABNORMAL LOW (ref 0.57–1.00)
GFR calc Af Amer: 156 mL/min/{1.73_m2} (ref >59)
GFR calc non Af Amer: 135 mL/min/{1.73_m2} (ref >59)
Globulin, Total: 2.4 g/dL (ref 1.5–4.5)
Glucose: 75 mg/dL (ref 65–99)
Potassium: 3.8 mmol/L (ref 3.5–5.2)
Sodium: 141 mmol/L (ref 134–144)
Total Protein: 6.8 g/dL (ref 6.0–8.5)

## 2018-02-17 LAB — URINALYSIS, ROUTINE W REFLEX MICROSCOPIC
BILIRUBIN UA: NEGATIVE
Glucose, UA: NEGATIVE
Ketones, UA: NEGATIVE
Leukocytes, UA: NEGATIVE
Nitrite, UA: NEGATIVE
Protein, UA: NEGATIVE
RBC, UA: NEGATIVE
Specific Gravity, UA: 1.022 (ref 1.005–1.030)
Urobilinogen, Ur: 0.2 mg/dL (ref 0.2–1.0)
pH, UA: 5.5 (ref 5.0–7.5)

## 2018-02-17 LAB — SICKLE CELL SCREEN: Sickle Cell Screen: NEGATIVE

## 2018-02-18 LAB — URINE CULTURE

## 2018-02-18 LAB — GC/CHLAMYDIA PROBE AMP
Chlamydia trachomatis, NAA: NEGATIVE
Neisseria gonorrhoeae by PCR: NEGATIVE

## 2018-03-16 ENCOUNTER — Ambulatory Visit (INDEPENDENT_AMBULATORY_CARE_PROVIDER_SITE_OTHER): Payer: Medicaid Other | Admitting: Obstetrics & Gynecology

## 2018-03-16 ENCOUNTER — Other Ambulatory Visit: Payer: Self-pay | Admitting: Women's Health

## 2018-03-16 ENCOUNTER — Telehealth: Payer: Self-pay | Admitting: *Deleted

## 2018-03-16 VITALS — BP 141/77 | HR 90 | Wt 223.0 lb

## 2018-03-16 DIAGNOSIS — Z3481 Encounter for supervision of other normal pregnancy, first trimester: Secondary | ICD-10-CM

## 2018-03-16 DIAGNOSIS — Z331 Pregnant state, incidental: Secondary | ICD-10-CM

## 2018-03-16 DIAGNOSIS — Z3A13 13 weeks gestation of pregnancy: Secondary | ICD-10-CM

## 2018-03-16 DIAGNOSIS — Z1389 Encounter for screening for other disorder: Secondary | ICD-10-CM

## 2018-03-16 LAB — POCT URINALYSIS DIPSTICK OB
Blood, UA: NEGATIVE
Glucose, UA: NEGATIVE
Leukocytes, UA: NEGATIVE
NITRITE UA: NEGATIVE
PROTEIN: NEGATIVE

## 2018-03-16 NOTE — Progress Notes (Signed)
   LOW-RISK PREGNANCY VISIT Patient name: Sara Morse MRN [id number]  Date of birth: 08-08-1995 Chief Complaint:   Routine Prenatal Visit  History of Present Illness:   Sara Morse is a 23 y.o. G37P0010 female at [redacted]w[redacted]d with an Estimated Date of Delivery: 09/18/18 being seen today for ongoing management of a low-risk pregnancy.  Today she reports no complaints. Contractions: Not present. Vag. Bleeding: None.  Movement: Absent. denies leaking of fluid. Review of Systems:   Pertinent items are noted in HPI Denies abnormal vaginal discharge w/ itching/odor/irritation, headaches, visual changes, shortness of breath, chest pain, abdominal pain, severe nausea/vomiting, or problems with urination or bowel movements unless otherwise stated above. Pertinent History Reviewed:  Reviewed past medical,surgical, social, obstetrical and family history.  Reviewed problem list, medications and allergies. Physical Assessment:   Vitals:   03/16/18 1518  BP: (!) 141/77  Pulse: 90  Weight: 223 lb (101.2 kg)  Body mass index is 38.28 kg/m.        Physical Examination:   General appearance: Well appearing, and in no distress  Mental status: Alert, oriented to person, place, and time  Skin: Warm & dry  Cardiovascular: Normal heart rate noted  Respiratory: Normal respiratory effort, no distress  Abdomen: Soft, gravid, nontender  Pelvic: Cervical exam deferred         Extremities: Edema: None  Fetal Status: Fetal Heart Rate (bpm): 140   Movement: Absent    Results for orders placed or performed in visit on 03/16/18 (from the past 24 hour(s))  POC Urinalysis Dipstick OB   Collection Time: 03/16/18  3:30 PM  Result Value Ref Range   Color, UA     Clarity, UA     Glucose, UA Negative Negative   Bilirubin, UA     Ketones, UA large    Spec Grav, UA     Blood, UA neg    pH, UA     POC,PROTEIN,UA Negative Negative, Trace, Small (1+), Moderate (2+), Large (3+), 4+   Urobilinogen, UA     Nitrite, UA  neg    Leukocytes, UA Negative Negative   Appearance     Odor      Assessment & Plan:  1) Low-risk pregnancy G2P0010 at [redacted]w[redacted]d with an Estimated Date of Delivery: 09/18/18   2) stop prometrium in 4 days,    Meds: No orders of the defined types were placed in this encounter.  Labs/procedures today:   Plan:  Continue routine obstetrical care   Reviewed:  labor symptoms and general obstetric precautions including but not limited to vaginal bleeding, contractions, leaking of fluid and fetal movement were reviewed in detail with the patient.  All questions were answered  Follow-up: Return in about 4 weeks (around 04/13/2018) for LROB.  Orders Placed This Encounter  Procedures  . POC Urinalysis Dipstick OB   Lazaro Arms  03/16/2018 3:42 PM

## 2018-03-16 NOTE — Telephone Encounter (Signed)
Patient states she noticed some light pink spotting when she went to the bathroom this morning.  Only noticed it one time and is not having any cramping.  Informed patient that since it was one to time to monitor but urine will be checked at visit today.  Pt verbalized understanding with no further questions.

## 2018-03-17 LAB — PROTEIN, URINE, 24 HOUR
Protein, 24H Urine: 224 mg/24 hr — ABNORMAL HIGH (ref 30–150)
Protein, Ur: 22.4 mg/dL

## 2018-04-14 ENCOUNTER — Encounter: Payer: Self-pay | Admitting: Advanced Practice Midwife

## 2018-04-14 ENCOUNTER — Ambulatory Visit (INDEPENDENT_AMBULATORY_CARE_PROVIDER_SITE_OTHER): Payer: Medicaid Other | Admitting: Advanced Practice Midwife

## 2018-04-14 ENCOUNTER — Other Ambulatory Visit: Payer: Self-pay

## 2018-04-14 VITALS — BP 145/75 | HR 114 | Wt 218.0 lb

## 2018-04-14 DIAGNOSIS — Z363 Encounter for antenatal screening for malformations: Secondary | ICD-10-CM

## 2018-04-14 DIAGNOSIS — Z3A17 17 weeks gestation of pregnancy: Secondary | ICD-10-CM

## 2018-04-14 DIAGNOSIS — Z1389 Encounter for screening for other disorder: Secondary | ICD-10-CM

## 2018-04-14 DIAGNOSIS — Z331 Pregnant state, incidental: Secondary | ICD-10-CM

## 2018-04-14 DIAGNOSIS — O0992 Supervision of high risk pregnancy, unspecified, second trimester: Secondary | ICD-10-CM

## 2018-04-14 LAB — POCT URINALYSIS DIPSTICK OB
Blood, UA: NEGATIVE
Glucose, UA: NEGATIVE
Ketones, UA: NEGATIVE
Leukocytes, UA: NEGATIVE
Nitrite, UA: NEGATIVE
POC,PROTEIN,UA: NEGATIVE

## 2018-04-14 NOTE — Patient Instructions (Signed)
Sara Morse, I greatly value your feedback.  If you receive a survey following your visit with [place] today, we appreciate you taking the time to fill it out.  Thanks, Cathie Beams, CNM     Second Trimester of Pregnancy The second trimester is from week 14 through week 27 (months 4 through 6). The second trimester is often a time when you feel your best. Your body has adjusted to being pregnant, and you begin to feel better physically. Usually, morning sickness has lessened or quit completely, you may have more energy, and you may have an increase in appetite. The second trimester is also a time when the fetus is growing rapidly. At the end of the sixth month, the fetus is about 9 inches long and weighs about 1 pounds. You will likely begin to feel the baby move (quickening) between 16 and 20 weeks of pregnancy. Body changes during your second trimester Your body continues to go through many changes during your second trimester. The changes vary from woman to woman.  Your weight will continue to increase. You will notice your lower abdomen bulging out.  You may begin to get stretch marks on your hips, abdomen, and breasts.  You may develop headaches that can be relieved by medicines. The medicines should be approved by your health care provider.  You may urinate more often because the fetus is pressing on your bladder.  You may develop or continue to have heartburn as a result of your pregnancy.  You may develop constipation because certain hormones are causing the muscles that push waste through your intestines to slow down.  You may develop hemorrhoids or swollen, bulging veins (varicose veins).  You may have back pain. This is caused by: ? Weight gain. ? Pregnancy hormones that are relaxing the joints in your pelvis. ? A shift in weight and the muscles that support your balance.  Your breasts will continue to grow and they will continue to become tender.  Your gums may  bleed and may be sensitive to brushing and flossing.  Dark spots or blotches (chloasma, mask of pregnancy) may develop on your face. This will likely fade after the baby is born.  A dark line from your belly button to the pubic area (linea nigra) may appear. This will likely fade after the baby is born.  You may have changes in your hair. These can include thickening of your hair, rapid growth, and changes in texture. Some women also have hair loss during or after pregnancy, or hair that feels dry or thin. Your hair will most likely return to normal after your baby is born.  What to expect at prenatal visits During a routine prenatal visit:  You will be weighed to make sure you and the fetus are growing normally.  Your blood pressure will be taken.  Your abdomen will be measured to track your baby's growth.  The fetal heartbeat will be listened to.  Any test results from the previous visit will be discussed.  Your health care provider may ask you:  How you are feeling.  If you are feeling the baby move.  If you have had any abnormal symptoms, such as leaking fluid, bleeding, severe headaches, or abdominal cramping.  If you are using any tobacco products, including cigarettes, chewing tobacco, and electronic cigarettes.  If you have any questions.  Other tests that may be performed during your second trimester include:  Blood tests that check for: ? Low iron levels (anemia). ? High  blood sugar that affects pregnant women (gestational diabetes) between 28 and 28 weeks. ? Rh antibodies. This is to check for a protein on red blood cells (Rh factor).  Urine tests to check for infections, diabetes, or protein in the urine.  An ultrasound to confirm the proper growth and development of the baby.  An amniocentesis to check for possible genetic problems.  Fetal screens for spina bifida and Down syndrome.  HIV (human immunodeficiency virus) testing. Routine prenatal testing  includes screening for HIV, unless you choose not to have this test.  Follow these instructions at home: Medicines  Follow your health care provider's instructions regarding medicine use. Specific medicines may be either safe or unsafe to take during pregnancy.  Take a prenatal vitamin that contains at least 600 micrograms (mcg) of folic acid.  If you develop constipation, try taking a stool softener if your health care provider approves. Eating and drinking  Eat a balanced diet that includes fresh fruits and vegetables, whole grains, good sources of protein such as meat, eggs, or tofu, and low-fat dairy. Your health care provider will help you determine the amount of weight gain that is right for you.  Avoid raw meat and uncooked cheese. These carry germs that can cause birth defects in the baby.  If you have low calcium intake from food, talk to your health care provider about whether you should take a daily calcium supplement.  Limit foods that are high in fat and processed sugars, such as fried and sweet foods.  To prevent constipation: ? Drink enough fluid to keep your urine clear or pale yellow. ? Eat foods that are high in fiber, such as fresh fruits and vegetables, whole grains, and beans. Activity  Exercise only as directed by your health care provider. Most women can continue their usual exercise routine during pregnancy. Try to exercise for 30 minutes at least 5 days a week. Stop exercising if you experience uterine contractions.  Avoid heavy lifting, wear low heel shoes, and practice good posture.  A sexual relationship may be continued unless your health care provider directs you otherwise. Relieving pain and discomfort  Wear a good support bra to prevent discomfort from breast tenderness.  Take warm sitz baths to soothe any pain or discomfort caused by hemorrhoids. Use hemorrhoid cream if your health care provider approves.  Rest with your legs elevated if you have  leg cramps or low back pain.  If you develop varicose veins, wear support hose. Elevate your feet for 15 minutes, 3-4 times a day. Limit salt in your diet. Prenatal Care  Write down your questions. Take them to your prenatal visits.  Keep all your prenatal visits as told by your health care provider. This is important. Safety  Wear your seat belt at all times when driving.  Make a list of emergency phone numbers, including numbers for family, friends, the hospital, and police and fire departments. General instructions  Ask your health care provider for a referral to a local prenatal education class. Begin classes no later than the beginning of month 6 of your pregnancy.  Ask for help if you have counseling or nutritional needs during pregnancy. Your health care provider can offer advice or refer you to specialists for help with various needs.  Do not use hot tubs, steam rooms, or saunas.  Do not douche or use tampons or scented sanitary pads.  Do not cross your legs for long periods of time.  Avoid cat litter boxes and soil  used by cats. These carry germs that can cause birth defects in the baby and possibly loss of the fetus by miscarriage or stillbirth.  Avoid all smoking, herbs, alcohol, and unprescribed drugs. Chemicals in these products can affect the formation and growth of the baby.  Do not use any products that contain nicotine or tobacco, such as cigarettes and e-cigarettes. If you need help quitting, ask your health care provider.  Visit your dentist if you have not gone yet during your pregnancy. Use a soft toothbrush to brush your teeth and be gentle when you floss. Contact a health care provider if:  You have dizziness.  You have mild pelvic cramps, pelvic pressure, or nagging pain in the abdominal area.  You have persistent nausea, vomiting, or diarrhea.  You have a bad smelling vaginal discharge.  You have pain when you urinate. Get help right away if:  You  have a fever.  You are leaking fluid from your vagina.  You have spotting or bleeding from your vagina.  You have severe abdominal cramping or pain.  You have rapid weight gain or weight loss.  You have shortness of breath with chest pain.  You notice sudden or extreme swelling of your face, hands, ankles, feet, or legs.  You have not felt your baby move in over an hour.  You have severe headaches that do not go away when you take medicine.  You have vision changes. Summary  The second trimester is from week 14 through week 27 (months 4 through 6). It is also a time when the fetus is growing rapidly.  Your body goes through many changes during pregnancy. The changes vary from woman to woman.  Avoid all smoking, herbs, alcohol, and unprescribed drugs. These chemicals affect the formation and growth your baby.  Do not use any tobacco products, such as cigarettes, chewing tobacco, and e-cigarettes. If you need help quitting, ask your health care provider.  Contact your health care provider if you have any questions. Keep all prenatal visits as told by your health care provider. This is important. This information is not intended to replace advice given to you by your health care provider. Make sure you discuss any questions you have with your health care provider.      CHILDBIRTH CLASSES [phone] is the phone number for Pregnancy Classes or hospital tours at [place] will be referred to  [email or web address] for more information on childbirth classes  At this site you may register for classes. You may sign up for a waiting list if classes are full. Please SIGN UP FOR THIS!.   When the waiting list becomes long, sometimes new classes can be added.

## 2018-04-14 NOTE — Progress Notes (Signed)
  G2P0010 [redacted]w[redacted]d Estimated Date of Delivery: 09/18/18  Blood pressure (!) 145/75, pulse (!) 114, weight 218 lb (98.9 kg), last menstrual period 11/25/2017, unknown if currently breastfeeding.   BP weight and urine results all reviewed and noted.  Please refer to the obstetrical flow sheet for the fundal height and fetal heart rate documentation:  Patient denies any bleeding and no rupture of membranes symptoms or regular contractions. Patient is without complaints. Appetite improving All questions were answered.   Physical Assessment:   Vitals:   04/14/18 1546  BP: (!) 145/75  Pulse: (!) 114  Weight: 218 lb (98.9 kg)  Body mass index is 37.42 kg/m.        Physical Examination:   General appearance: Well appearing, and in no distress  Mental status: Alert, oriented to person, place, and time  Skin: Warm & dry  Cardiovascular: Normal heart rate noted  Respiratory: Normal respiratory effort, no distress  Abdomen: Soft, gravid, nontender  Pelvic: Cervical exam deferred         Extremities: Edema: None  Fetal Status: Fetal Heart Rate (bpm): 159   Movement: Absent    Results for orders placed or performed in visit on 04/14/18 (from the past 24 hour(s))  POC Urinalysis Dipstick OB   Collection Time: 04/14/18  3:46 PM  Result Value Ref Range   Color, UA     Clarity, UA     Glucose, UA Negative Negative   Bilirubin, UA     Ketones, UA neg    Spec Grav, UA     Blood, UA neg    pH, UA     POC,PROTEIN,UA Negative Negative, Trace, Small (1+), Moderate (2+), Large (3+), 4+   Urobilinogen, UA     Nitrite, UA neg    Leukocytes, UA Negative Negative   Appearance     Odor       Orders Placed This Encounter  Procedures  . US OB Comp + 14 Wk  . POC Urinalysis Dipstick OB    Plan:  Continued routine obstetrical care,   Return in about 2 weeks (around 04/28/2018) for LROB, AJ:OINOMVE.

## 2018-04-26 ENCOUNTER — Telehealth: Payer: Self-pay | Admitting: *Deleted

## 2018-04-26 NOTE — Telephone Encounter (Signed)
Called patient and informed her of COVID 19 restrictions. Patient reports no symptoms at this time or contact with persons infected or suspected of infection.  

## 2018-04-27 ENCOUNTER — Ambulatory Visit (INDEPENDENT_AMBULATORY_CARE_PROVIDER_SITE_OTHER): Payer: Medicaid Other

## 2018-04-27 ENCOUNTER — Ambulatory Visit (INDEPENDENT_AMBULATORY_CARE_PROVIDER_SITE_OTHER): Payer: Medicaid Other | Admitting: Women's Health

## 2018-04-27 ENCOUNTER — Encounter: Payer: Self-pay | Admitting: Women's Health

## 2018-04-27 ENCOUNTER — Other Ambulatory Visit: Payer: Self-pay

## 2018-04-27 VITALS — BP 137/75 | HR 103 | Temp 98.9°F | Wt 218.5 lb

## 2018-04-27 DIAGNOSIS — Z3A19 19 weeks gestation of pregnancy: Secondary | ICD-10-CM | POA: Diagnosis not present

## 2018-04-27 DIAGNOSIS — O10912 Unspecified pre-existing hypertension complicating pregnancy, second trimester: Secondary | ICD-10-CM

## 2018-04-27 DIAGNOSIS — O0990 Supervision of high risk pregnancy, unspecified, unspecified trimester: Secondary | ICD-10-CM

## 2018-04-27 DIAGNOSIS — O10919 Unspecified pre-existing hypertension complicating pregnancy, unspecified trimester: Secondary | ICD-10-CM

## 2018-04-27 DIAGNOSIS — Z331 Pregnant state, incidental: Secondary | ICD-10-CM

## 2018-04-27 DIAGNOSIS — O0992 Supervision of high risk pregnancy, unspecified, second trimester: Secondary | ICD-10-CM

## 2018-04-27 DIAGNOSIS — Z363 Encounter for antenatal screening for malformations: Secondary | ICD-10-CM

## 2018-04-27 DIAGNOSIS — Z1389 Encounter for screening for other disorder: Secondary | ICD-10-CM

## 2018-04-27 LAB — POCT URINALYSIS DIPSTICK OB
Blood, UA: NEGATIVE
Glucose, UA: NEGATIVE
Ketones, UA: NEGATIVE
LEUKOCYTES UA: NEGATIVE
Nitrite, UA: NEGATIVE
POC,PROTEIN,UA: NEGATIVE

## 2018-04-27 NOTE — Patient Instructions (Signed)
Sara Morse, I greatly value your feedback.  If you receive a survey following your visit with [place] today, we appreciate you taking the time to fill it out.  Thanks, Joellyn Haff, CNM, [facility]  [facility] HAS MOVED!!! It is now [facility] at [facility] ([address] [place], [place] [address]) Entrance located off of [address] Free 24/7 valet parking     Second Trimester of Pregnancy The second trimester is from week 14 through week 27 (months 4 through 6). The second trimester is often a time when you feel your best. Your body has adjusted to being pregnant, and you begin to feel better physically. Usually, morning sickness has lessened or quit completely, you may have more energy, and you may have an increase in appetite. The second trimester is also a time when the fetus is growing rapidly. At the end of the sixth month, the fetus is about 9 inches long and weighs about 1 pounds. You will likely begin to feel the baby move (quickening) between 16 and 20 weeks of pregnancy. Body changes during your second trimester Your body continues to go through many changes during your second trimester. The changes vary from woman to woman.  Your weight will continue to increase. You will notice your lower abdomen bulging out.  You may begin to get stretch marks on your hips, abdomen, and breasts.  You may develop headaches that can be relieved by medicines. The medicines should be approved by your health care provider.  You may urinate more often because the fetus is pressing on your bladder.  You may develop or continue to have heartburn as a result of your pregnancy.  You may develop constipation because certain hormones are causing the muscles that push waste through your intestines to slow down.  You may develop hemorrhoids or swollen, bulging veins (varicose veins).  You may have back pain. This is caused by: ? Weight gain. ? Pregnancy hormones that are relaxing  the joints in your pelvis. ? A shift in weight and the muscles that support your balance.  Your breasts will continue to grow and they will continue to become tender.  Your gums may bleed and may be sensitive to brushing and flossing.  Dark spots or blotches (chloasma, mask of pregnancy) may develop on your face. This will likely fade after the baby is born.  A dark line from your belly button to the pubic area (linea nigra) may appear. This will likely fade after the baby is born.  You may have changes in your hair. These can include thickening of your hair, rapid growth, and changes in texture. Some women also have hair loss during or after pregnancy, or hair that feels dry or thin. Your hair will most likely return to normal after your baby is born.  What to expect at prenatal visits During a routine prenatal visit:  You will be weighed to make sure you and the fetus are growing normally.  Your blood pressure will be taken.  Your abdomen will be measured to track your baby's growth.  The fetal heartbeat will be listened to.  Any test results from the previous visit will be discussed.  Your health care provider may ask you:  How you are feeling.  If you are feeling the baby move.  If you have had any abnormal symptoms, such as leaking fluid, bleeding, severe headaches, or abdominal cramping.  If you are using any tobacco products, including cigarettes, chewing tobacco,  and electronic cigarettes.  If you have any questions.  Other tests that may be performed during your second trimester include:  Blood tests that check for: ? Low iron levels (anemia). ? High blood sugar that affects pregnant women (gestational diabetes) between 55 and 28 weeks. ? Rh antibodies. This is to check for a protein on red blood cells (Rh factor).  Urine tests to check for infections, diabetes, or protein in the urine.  An ultrasound to confirm the proper growth and development of the baby.   An amniocentesis to check for possible genetic problems.  Fetal screens for spina bifida and Down syndrome.  HIV (human immunodeficiency virus) testing. Routine prenatal testing includes screening for HIV, unless you choose not to have this test.  Follow these instructions at home: Medicines  Follow your health care provider's instructions regarding medicine use. Specific medicines may be either safe or unsafe to take during pregnancy.  Take a prenatal vitamin that contains at least 600 micrograms (mcg) of folic acid.  If you develop constipation, try taking a stool softener if your health care provider approves. Eating and drinking  Eat a balanced diet that includes fresh fruits and vegetables, whole grains, good sources of protein such as meat, eggs, or tofu, and low-fat dairy. Your health care provider will help you determine the amount of weight gain that is right for you.  Avoid raw meat and uncooked cheese. These carry germs that can cause birth defects in the baby.  If you have low calcium intake from food, talk to your health care provider about whether you should take a daily calcium supplement.  Limit foods that are high in fat and processed sugars, such as fried and sweet foods.  To prevent constipation: ? Drink enough fluid to keep your urine clear or pale yellow. ? Eat foods that are high in fiber, such as fresh fruits and vegetables, whole grains, and beans. Activity  Exercise only as directed by your health care provider. Most women can continue their usual exercise routine during pregnancy. Try to exercise for 30 minutes at least 5 days a week. Stop exercising if you experience uterine contractions.  Avoid heavy lifting, wear low heel shoes, and practice good posture.  A sexual relationship may be continued unless your health care provider directs you otherwise. Relieving pain and discomfort  Wear a good support bra to prevent discomfort from breast tenderness.   Take warm sitz baths to soothe any pain or discomfort caused by hemorrhoids. Use hemorrhoid cream if your health care provider approves.  Rest with your legs elevated if you have leg cramps or low back pain.  If you develop varicose veins, wear support hose. Elevate your feet for 15 minutes, 3-4 times a day. Limit salt in your diet. Prenatal Care  Write down your questions. Take them to your prenatal visits.  Keep all your prenatal visits as told by your health care provider. This is important. Safety  Wear your seat belt at all times when driving.  Make a list of emergency phone numbers, including numbers for family, friends, the hospital, and police and fire departments. General instructions  Ask your health care provider for a referral to a local prenatal education class. Begin classes no later than the beginning of month 6 of your pregnancy.  Ask for help if you have counseling or nutritional needs during pregnancy. Your health care provider can offer advice or refer you to specialists for help with various needs.  Do not use hot  tubs, steam rooms, or saunas.  Do not douche or use tampons or scented sanitary pads.  Do not cross your legs for long periods of time.  Avoid cat litter boxes and soil used by cats. These carry germs that can cause birth defects in the baby and possibly loss of the fetus by miscarriage or stillbirth.  Avoid all smoking, herbs, alcohol, and unprescribed drugs. Chemicals in these products can affect the formation and growth of the baby.  Do not use any products that contain nicotine or tobacco, such as cigarettes and e-cigarettes. If you need help quitting, ask your health care provider.  Visit your dentist if you have not gone yet during your pregnancy. Use a soft toothbrush to brush your teeth and be gentle when you floss. Contact a health care provider if:  You have dizziness.  You have mild pelvic cramps, pelvic pressure, or nagging pain in the  abdominal area.  You have persistent nausea, vomiting, or diarrhea.  You have a bad smelling vaginal discharge.  You have pain when you urinate. Get help right away if:  You have a fever.  You are leaking fluid from your vagina.  You have spotting or bleeding from your vagina.  You have severe abdominal cramping or pain.  You have rapid weight gain or weight loss.  You have shortness of breath with chest pain.  You notice sudden or extreme swelling of your face, hands, ankles, feet, or legs.  You have not felt your baby move in over an hour.  You have severe headaches that do not go away when you take medicine.  You have vision changes. Summary  The second trimester is from week 14 through week 27 (months 4 through 6). It is also a time when the fetus is growing rapidly.  Your body goes through many changes during pregnancy. The changes vary from woman to woman.  Avoid all smoking, herbs, alcohol, and unprescribed drugs. These chemicals affect the formation and growth your baby.  Do not use any tobacco products, such as cigarettes, chewing tobacco, and e-cigarettes. If you need help quitting, ask your health care provider.  Contact your health care provider if you have any questions. Keep all prenatal visits as told by your health care provider. This is important. This information is not intended to replace advice given to you by your health care provider. Make sure you discuss any questions you have with your health care provider. Document Released: 01/14/2001 Document Revised: 06/28/2015 Document Reviewed: 03/23/2012 Elsevier Interactive Patient Education  2017 Elsevier Inc.  Coronavirus (COVID-19) Are you at risk?  Are you at risk for the Coronavirus (COVID-19)?  To be considered HIGH RISK for Coronavirus (COVID-19), you have to meet the following criteria:  . Traveled to [place], [place], [place], [place] or [place]; or in the [place] to [place], [place],  [place], or [place]; and have fever, cough, and shortness of breath within the last 2 weeks of travel OR . Been in close contact with a person diagnosed with COVID-19 within the last 2 weeks and have fever, cough, and shortness of breath . IF YOU DO NOT MEET THESE CRITERIA, YOU ARE CONSIDERED LOW RISK FOR COVID-19.  What to do if you are HIGH RISK for COVID-19?  Marland Kitchen If you are having a medical emergency, call 911. . Seek medical care right away. Before you go to a doctor's office, urgent care or emergency department, call ahead and tell them about your recent travel, contact  with someone diagnosed with COVID-19, and your symptoms. You should receive instructions from your physician's office regarding next steps of care.  . When you arrive at healthcare provider, tell the healthcare staff immediately you have returned from visiting [place], [place], [place], [place] or [place]; or traveled in the [place] to [place], [place], [place], or [place]; in the last two weeks or you have been in close contact with a person diagnosed with COVID-19 in the last 2 weeks.   . Tell the health care staff about your symptoms: fever, cough and shortness of breath. . After you have been seen by a medical provider, you will be either: o Tested for (COVID-19) and discharged home on quarantine except to seek medical care if symptoms worsen, and asked to  - Stay home and avoid contact with others until you get your results (4-5 days)  - Avoid travel on public transportation if possible (such as bus, train, or airplane) or o Sent to the Emergency Department by EMS for evaluation, COVID-19 testing, and possible admission depending on your condition and test results.  What to do if you are LOW RISK for COVID-19?  Reduce your risk of any infection by using the same precautions used for avoiding the common cold or flu:  Marland Kitchen Wash your hands often with soap and warm water for at least 20 seconds.  If soap and water  are not readily available, use an alcohol-based hand sanitizer with at least 60% alcohol.  . If coughing or sneezing, cover your mouth and nose by coughing or sneezing into the elbow areas of your shirt or coat, into a tissue or into your sleeve (not your hands). . Avoid shaking hands with others and consider head nods or verbal greetings only. . Avoid touching your eyes, nose, or mouth with unwashed hands.  . Avoid close contact with people who are sick. . Avoid places or events with large numbers of people in one location, like concerts or sporting events. . Carefully consider travel plans you have or are making. . If you are planning any travel outside or inside the [place], visit the CDC's Travelers' Health webpage for the latest health notices. . If you have some symptoms but not all symptoms, continue to monitor at home and seek medical attention if your symptoms worsen. . If you are having a medical emergency, call 911.   ADDITIONAL HEALTHCARE OPTIONS FOR PATIENTS  [facility] / e-Visit: [email or web address]         [facility]: [phone]  [facility]: [phone]                   [facility]: [phone]

## 2018-04-27 NOTE — Progress Notes (Signed)
[place] 19+3 wks,cephalic,posterior placenta gr 0,cx 4.1 cm,normal ovaries bilat,svp of fluid 4.4 cm,fhr 161 bpm,efw 318 g 69%,anatomy complete no obvious abnormalities

## 2018-04-27 NOTE — Progress Notes (Signed)
   HIGH-RISK PREGNANCY VISIT Patient name: Sara Morse MRN [id number]  Date of birth: 12/13/1995 Chief Complaint:   High Risk Gestation  History of Present Illness:   Sara Morse is a 23 y.o. G44P0010 female at [redacted]w[redacted]d with an Estimated Date of Delivery: 09/18/18 being seen today for ongoing management of a high-risk pregnancy complicated by [facility] dx early pregnancy- no meds.  Today she reports no complaints. Contractions: Not present. Vag. Bleeding: None.  Movement: Present. denies leaking of fluid.  Review of Systems:   Pertinent items are noted in HPI Denies abnormal vaginal discharge w/ itching/odor/irritation, headaches, visual changes, shortness of breath, chest pain, abdominal pain, severe nausea/vomiting, or problems with urination or bowel movements unless otherwise stated above. Pertinent History Reviewed:  Reviewed past medical,surgical, social, obstetrical and family history.  Reviewed problem list, medications and allergies. Physical Assessment:   Vitals:   04/27/18 1522  BP: 137/75  Pulse: (!) 103  Temp: 98.9 F (37.2 C)  Weight: 218 lb 8 oz (99.1 kg)  Body mass index is 37.51 kg/m.           Physical Examination:   General appearance: alert, well appearing, and in no distress  Mental status: alert, oriented to person, place, and time  Skin: warm & dry   Extremities: Edema: None    Cardiovascular: normal heart rate noted  Respiratory: normal respiratory effort, no distress  Abdomen: gravid, soft, non-tender  Pelvic: Cervical exam deferred         Fetal Status: Fetal Heart Rate (bpm): 152   Movement: Present    Fetal Surveillance Testing today: doppler For anatomy u/s after visit   Results for orders placed or performed in visit on 04/27/18 (from the past 24 hour(s))  POC Urinalysis Dipstick OB   Collection Time: 04/27/18  3:24 PM  Result Value Ref Range   Color, UA     Clarity, UA     Glucose, UA Negative Negative   Bilirubin, UA     Ketones, UA neg    Spec Grav, UA     Blood, UA neg    pH, UA     POC,PROTEIN,UA Negative Negative, Trace, Small (1+), Moderate (2+), Large (3+), 4+   Urobilinogen, UA     Nitrite, UA neg    Leukocytes, UA Negative Negative   Appearance     Odor      Assessment & Plan:  1) High-risk pregnancy G2P0010 at [redacted]w[redacted]d with an Estimated Date of Delivery: 09/18/18   2) CHTN, stable, no meds, ASA  Meds: No orders of the defined types were placed in this encounter.  Labs/procedures today: anatomoy u/s  Treatment Plan:  Growth u/s @ 24, 28, 33, 36wks     2x/wk testing nst/sono @ 36wks or weekly BPP   Deliver @ 39-40wks  Reviewed: Preterm labor symptoms and general obstetric precautions including but not limited to vaginal bleeding, contractions, leaking of fluid and fetal movement were reviewed in detail with the patient.  All questions were answered.  Follow-up: Return in about 4 weeks (around 05/25/2018) for HROB, US:EFW.  Orders Placed This Encounter  Procedures  . US OB Follow Up  . POC Urinalysis Dipstick OB   Cheral Marker CNM, [facility] 04/27/2018 4:31 PM

## 2018-05-24 ENCOUNTER — Telehealth: Payer: Self-pay | Admitting: *Deleted

## 2018-05-24 NOTE — Telephone Encounter (Signed)
Patient informed that we are not allowing visitors or children to come to appointments at this time. Patient denies any contact with anyone suspected or confirmed of having COVID-19. Pt denies fever, cough, sob, muscle pain, diarrhea, rash, vomiting, abdominal pain, red eye, weakness, bruising or bleeding, joint pain or severe headache.  

## 2018-05-25 ENCOUNTER — Encounter: Payer: Self-pay | Admitting: Obstetrics & Gynecology

## 2018-05-25 ENCOUNTER — Other Ambulatory Visit: Payer: Self-pay

## 2018-05-25 ENCOUNTER — Ambulatory Visit (INDEPENDENT_AMBULATORY_CARE_PROVIDER_SITE_OTHER): Payer: Medicaid Other

## 2018-05-25 ENCOUNTER — Ambulatory Visit (INDEPENDENT_AMBULATORY_CARE_PROVIDER_SITE_OTHER): Payer: Medicaid Other | Admitting: Obstetrics & Gynecology

## 2018-05-25 VITALS — BP 131/80 | HR 104 | Temp 98.2°F | Wt 221.0 lb

## 2018-05-25 DIAGNOSIS — Z362 Encounter for other antenatal screening follow-up: Secondary | ICD-10-CM | POA: Diagnosis not present

## 2018-05-25 DIAGNOSIS — O0990 Supervision of high risk pregnancy, unspecified, unspecified trimester: Secondary | ICD-10-CM

## 2018-05-25 DIAGNOSIS — Z3A23 23 weeks gestation of pregnancy: Secondary | ICD-10-CM

## 2018-05-25 DIAGNOSIS — O10919 Unspecified pre-existing hypertension complicating pregnancy, unspecified trimester: Secondary | ICD-10-CM

## 2018-05-25 DIAGNOSIS — O0992 Supervision of high risk pregnancy, unspecified, second trimester: Secondary | ICD-10-CM

## 2018-05-25 DIAGNOSIS — Z1389 Encounter for screening for other disorder: Secondary | ICD-10-CM

## 2018-05-25 DIAGNOSIS — Z331 Pregnant state, incidental: Secondary | ICD-10-CM

## 2018-05-25 DIAGNOSIS — O10912 Unspecified pre-existing hypertension complicating pregnancy, second trimester: Secondary | ICD-10-CM

## 2018-05-25 LAB — POCT URINALYSIS DIPSTICK OB
Blood, UA: NEGATIVE
Glucose, UA: NEGATIVE
Ketones, UA: NEGATIVE
Leukocytes, UA: NEGATIVE
Nitrite, UA: NEGATIVE
POC,PROTEIN,UA: NEGATIVE

## 2018-05-25 NOTE — Progress Notes (Signed)
   HIGH-RISK PREGNANCY VISIT Patient name: Sara Morse MRN [id number]  Date of birth: 1995-06-15 Chief Complaint:   Routine Prenatal Visit (u/s)  History of Present Illness:   Sara Morse is a 23 y.o. G39P0010 female at [redacted]w[redacted]d with an Estimated Date of Delivery: 09/18/18 being seen today for ongoing management of a high-risk pregnancy complicated by chronic HTN.  Today she reports no complaints. Contractions: Not present.  .  Movement: Present. denies leaking of fluid.  Review of Systems:   Pertinent items are noted in HPI Denies abnormal vaginal discharge w/ itching/odor/irritation, headaches, visual changes, shortness of breath, chest pain, abdominal pain, severe nausea/vomiting, or problems with urination or bowel movements unless otherwise stated above. Pertinent History Reviewed:  Reviewed past medical,surgical, social, obstetrical and family history.  Reviewed problem list, medications and allergies. Physical Assessment:   Vitals:   05/25/18 1546  BP: 131/80  Pulse: (!) 104  Temp: 98.2 F (36.8 C)  Weight: 221 lb (100.2 kg)  Body mass index is 37.93 kg/m.           Physical Examination:   General appearance: alert, well appearing, and in no distress  Mental status: alert, oriented to person, place, and time  Skin: warm & dry   Extremities: Edema: None    Cardiovascular: normal heart rate noted  Respiratory: normal respiratory effort, no distress  Abdomen: gravid, soft, non-tender  Pelvic: Cervical exam deferred         Fetal Status: Fetal Heart Rate (bpm): 157 Fundal Height: 24 cm Movement: Present    Fetal Surveillance Testing today: sonogram   Results for orders placed or performed in visit on 05/25/18 (from the past 24 hour(s))  POC Urinalysis Dipstick OB   Collection Time: 05/25/18  3:51 PM  Result Value Ref Range   Color, UA     Clarity, UA     Glucose, UA Negative Negative   Bilirubin, UA     Ketones, UA neg    Spec Grav, UA     Blood, UA neg    pH, UA      POC,PROTEIN,UA Negative Negative, Trace, Small (1+), Moderate (2+), Large (3+), 4+   Urobilinogen, UA     Nitrite, UA neg    Leukocytes, UA Negative Negative   Appearance     Odor      Assessment & Plan:  1) High-risk pregnancy G2P0010 at [redacted]w[redacted]d with an Estimated Date of Delivery: 09/18/18   2) CHTN, stable, no meds, good growth, normal sonogram, pt is eating properly and walking alot    Meds: No orders of the defined types were placed in this encounter.   Labs/procedures today: sonogram  Treatment Plan:  Sonogram 4 weeks  Reviewed: Preterm labor symptoms and general obstetric precautions including but not limited to vaginal bleeding, contractions, leaking of fluid and fetal movement were reviewed in detail with the patient.  All questions were answered.  Follow-up: Return in about 4 weeks (around 06/22/2018) for sonogram for weight, , PN2, HROB.  Orders Placed This Encounter  Procedures  . POC Urinalysis Dipstick OB   Lazaro Arms  05/25/2018 3:57 PM

## 2018-05-25 NOTE — Progress Notes (Signed)
[place] 23+3 wks,cephalic,cx 5 cm,posterior placenta gr 0,normal ovaries bilat,svp of fluid 5 cm,fhr 157 bpm,efw 663 g 74%

## 2018-06-21 ENCOUNTER — Other Ambulatory Visit: Payer: Self-pay | Admitting: Obstetrics & Gynecology

## 2018-06-21 ENCOUNTER — Encounter: Payer: Self-pay | Admitting: *Deleted

## 2018-06-21 DIAGNOSIS — O10919 Unspecified pre-existing hypertension complicating pregnancy, unspecified trimester: Secondary | ICD-10-CM

## 2018-06-22 ENCOUNTER — Other Ambulatory Visit: Payer: Medicaid Other

## 2018-06-22 ENCOUNTER — Other Ambulatory Visit: Payer: Self-pay

## 2018-06-22 ENCOUNTER — Encounter: Payer: Self-pay | Admitting: Women's Health

## 2018-06-22 ENCOUNTER — Ambulatory Visit (INDEPENDENT_AMBULATORY_CARE_PROVIDER_SITE_OTHER): Payer: Medicaid Other | Admitting: Women's Health

## 2018-06-22 ENCOUNTER — Ambulatory Visit (INDEPENDENT_AMBULATORY_CARE_PROVIDER_SITE_OTHER): Payer: Medicaid Other

## 2018-06-22 VITALS — BP 130/62 | HR 112 | Temp 99.1°F | Wt 219.2 lb

## 2018-06-22 DIAGNOSIS — F329 Major depressive disorder, single episode, unspecified: Secondary | ICD-10-CM

## 2018-06-22 DIAGNOSIS — Z3A27 27 weeks gestation of pregnancy: Secondary | ICD-10-CM

## 2018-06-22 DIAGNOSIS — O99342 Other mental disorders complicating pregnancy, second trimester: Secondary | ICD-10-CM

## 2018-06-22 DIAGNOSIS — F419 Anxiety disorder, unspecified: Secondary | ICD-10-CM

## 2018-06-22 DIAGNOSIS — Z362 Encounter for other antenatal screening follow-up: Secondary | ICD-10-CM

## 2018-06-22 DIAGNOSIS — Z1389 Encounter for screening for other disorder: Secondary | ICD-10-CM

## 2018-06-22 DIAGNOSIS — O0992 Supervision of high risk pregnancy, unspecified, second trimester: Secondary | ICD-10-CM

## 2018-06-22 DIAGNOSIS — O0990 Supervision of high risk pregnancy, unspecified, unspecified trimester: Secondary | ICD-10-CM

## 2018-06-22 DIAGNOSIS — O10919 Unspecified pre-existing hypertension complicating pregnancy, unspecified trimester: Secondary | ICD-10-CM

## 2018-06-22 DIAGNOSIS — Z23 Encounter for immunization: Secondary | ICD-10-CM | POA: Diagnosis not present

## 2018-06-22 DIAGNOSIS — Z331 Pregnant state, incidental: Secondary | ICD-10-CM

## 2018-06-22 LAB — POCT URINALYSIS DIPSTICK OB
Blood, UA: NEGATIVE
Glucose, UA: NEGATIVE
Leukocytes, UA: NEGATIVE
Nitrite, UA: NEGATIVE
POC,PROTEIN,UA: NEGATIVE

## 2018-06-22 MED ORDER — SERTRALINE HCL 50 MG PO TABS: 50.0000 mg | ORAL_TABLET | Freq: Every day | ORAL | 6 refills | Status: DC

## 2018-06-22 NOTE — Progress Notes (Signed)
[place] 27+3 WKS,cephalic,cx 3.3 cm,normal ovaries,posterior placenta gr 0,afi 13 cm,fhr 146 bpm,efw 1179 g 65%

## 2018-06-22 NOTE — Progress Notes (Signed)
HIGH-RISK PREGNANCY VISIT Patient name: Sara Morse MRN [id number]  Date of birth: 11/04/1995 Chief Complaint:   High Risk Gestation (pn2)  History of Present Illness:   Sara Morse is a 23 y.o. G40P0010 female at [redacted]w[redacted]d with an Estimated Date of Delivery: 09/18/18 being seen today for ongoing management of a high-risk pregnancy complicated by chronic HTN no meds Today she reports no appetite, feels dep/anx worse, stopped zoloft 100mg earlier b/c she was scared to take it w/ pregnancy. Denies SI/HI. Not sleeping well, not finding joy in things she used to. Home bp's 130s/80s. Denies ha, visual changes, ruq/epigastric pain, n/v.   Contractions: Irregular.  .  Movement: Present. denies leaking of fluid.  Review of Systems:   Pertinent items are noted in HPI Denies abnormal vaginal discharge w/ itching/odor/irritation, headaches, visual changes, shortness of breath, chest pain, abdominal pain, severe nausea/vomiting, or problems with urination or bowel movements unless otherwise stated above. Pertinent History Reviewed:  Reviewed past medical,surgical, social, obstetrical and family history.  Reviewed problem list, medications and allergies. Physical Assessment:   Vitals:   06/22/18 0959 06/22/18 1031  BP: (!) 150/85 130/62  Pulse: (!) 112   Temp: 99.1 F (37.3 C)   Weight: 219 lb 3.2 oz (99.4 kg)   Body mass index is 37.63 kg/m.           Physical Examination:   General appearance: alert, well appearing, and in no distress  Mental status: alert, oriented to person, place, and time  Skin: warm & dry   Extremities: Edema: None    Cardiovascular: normal heart rate noted  Respiratory: normal respiratory effort, no distress  Abdomen: gravid, soft, non-tender  Pelvic: Cervical exam deferred         Fetal Status: Fetal Heart Rate (bpm): 146 u/s   Movement: Present    Fetal Surveillance Testing today: [place] 27+3 WKS,cephalic,cx 3.3 cm,normal ovaries,posterior placenta gr 0,afi 13 cm,fhr  146 bpm,efw 1179 g 65%   Results for orders placed or performed in visit on 06/22/18 (from the past 24 hour(s))  POC Urinalysis Dipstick OB   Collection Time: 06/22/18 10:06 AM  Result Value Ref Range   Color, UA     Clarity, UA     Glucose, UA Negative Negative   Bilirubin, UA     Ketones, UA large    Spec Grav, UA     Blood, UA neg    pH, UA     POC,PROTEIN,UA Negative Negative, Trace, Small (1+), Moderate (2+), Large (3+), 4+   Urobilinogen, UA     Nitrite, UA neg    Leukocytes, UA Negative Negative   Appearance     Odor      Assessment & Plan:  1) High-risk pregnancy G2P0010 at [redacted]w[redacted]d with an Estimated Date of Delivery: 09/18/18   2) CHTN, no meds, bp normal today on recheck, and has been normal at home. Start checking QID, send me a mychart pic in 1wk or sooner if getting 150/95 or greater consistently  3) Dep/anx, restart zoloft @ 50mg , rx sent  Meds:  Meds ordered this encounter  Medications   sertraline (ZOLOFT) 50 MG tablet    Sig: Take 1 tablet (50 mg total) by mouth daily.    Dispense:  30 tablet    Refill:  6    Order Specific Question:   Supervising Provider    Answer:   Duane Lope H [[id number]]    Labs/procedures today: u/s, pn2, tdap  Treatment Plan: Growth u/s @  32, 36wks     2x/wk testing nst/sono @ 36wks or weekly BPP   Deliver @ 39-40wks:______    Reviewed: Preterm labor symptoms and general obstetric precautions including but not limited to vaginal bleeding, contractions, leaking of fluid and fetal movement were reviewed in detail with the patient.  All questions were answered.  Follow-up: Return in about 4 weeks (around 07/20/2018) for HROB, US:EFW.  Orders Placed This Encounter  Procedures   US OB Follow Up   Tdap vaccine greater than or equal to 7yo IM   POC Urinalysis Dipstick OB   Cheral MarkerKimberly R Ardit Danh CNM, [facility] 06/22/2018 10:44 AM

## 2018-06-22 NOTE — Patient Instructions (Addendum)
Sara Morse, I greatly value your feedback.  If you receive a survey following your visit with us today, we appreciate you taking the time to fill it out.  Thanks, Sara Morse, CNM, [facility]-BC  [facility] is now [facility] ([address] [place], [place] [address]) Entrance located off of [address] Free 24/7 valet parking    Home Blood Pressure Monitoring for Patients  Check your blood pressure 4 times daily and keep a log- send me a picture on mychart next Tuesday, or earlier if you are getting many >150 on top or >95 on bottom.  If blood pressure is >=160 on top or >=110 on bottom, check again in 5 minutes, if still this high, call us or go to [facility] to be evaluated   Helpful Tips for Accurate Home Blood Pressure Checks  . Don't smoke, exercise, or drink caffeine 30 minutes before checking your BP . Use the restroom before checking your BP (a full bladder can raise your pressure) . Relax in a comfortable upright chair . Feet on the ground . Left arm resting comfortably on a flat surface at the level of your heart . Legs uncrossed . Back supported . Sit quietly and don't talk . Place the cuff on your bare arm . Adjust snuggly, so that only two fingertips can fit between your skin and the top of the cuff . Check 2 readings separated by at least one minute . Keep a log of your BP readings . For a visual, please reference this diagram: http://ccnc.care/bpdiagram  Provider Name: [facility]     Phone: [phone]  Zone 1: ALL CLEAR  Continue to monitor your symptoms:  . BP reading is less than 140 (top number) or less than 90 (bottom number)  . No right upper stomach pain . No headaches or seeing spots . No feeling nauseated or throwing up . No swelling in face and hands  Zone 2: CAUTION Call your doctor's office for any of the following:  . BP reading is greater than 140 (top number) or greater than 90  (bottom number)  . Stomach pain under your ribs in the middle or right side . Headaches or seeing spots . Feeling nauseated or throwing up . Swelling in face and hands  Zone 3: EMERGENCY  Seek immediate medical care if you have any of the following:  . BP reading is greater than160 (top number) or greater than 110 (bottom number) . Severe headaches not improving with Tylenol . Serious difficulty catching your breath . Any worsening symptoms from Zone 2   Call the office [phone]) or go to [facility] if:  You begin to have strong, frequent contractions  Your water breaks.  Sometimes it is a big gush of fluid, sometimes it is just a trickle that keeps getting your panties wet or running down your legs  You have vaginal bleeding.  It is normal to have a small amount of spotting if your cervix was checked.   You don't feel your baby moving like normal.  If you don't, get you something to eat and drink and lay down and focus on feeling your baby move.  You should feel at least 10 movements in 2 hours.  If you don't, you should call the office or go to [facility].    Tdap Vaccine  It is recommended that you get the Tdap vaccine during the third trimester of EACH pregnancy  to help protect your baby from getting pertussis (whooping cough)  27-36 weeks is the BEST time to do this so that you can pass the protection on to your baby. During pregnancy is better than after pregnancy, but if you are unable to get it during pregnancy it will be offered at the hospital.   You can get this vaccine with [place], at the health department, your family doctor, or some local pharmacies  Everyone who will be around your baby should also be up-to-date on their vaccines before the baby comes. Adults (who are not pregnant) only need 1 dose of Tdap during adulthood.   Third Trimester of Pregnancy The third trimester is from week 29 through week 42, months 7 through 9. The third trimester is a time  when the fetus is growing rapidly. At the end of the ninth month, the fetus is about 20 inches in length and weighs 6-10 pounds.  BODY CHANGES Your body goes through many changes during pregnancy. The changes vary from woman to woman.   Your weight will continue to increase. You can expect to gain 25-35 pounds (11-16 kg) by the end of the pregnancy.  You may begin to get stretch marks on your hips, abdomen, and breasts.  You may urinate more often because the fetus is moving lower into your pelvis and pressing on your bladder.  You may develop or continue to have heartburn as a result of your pregnancy.  You may develop constipation because certain hormones are causing the muscles that push waste through your intestines to slow down.  You may develop hemorrhoids or swollen, bulging veins (varicose veins).  You may have pelvic pain because of the weight gain and pregnancy hormones relaxing your joints between the bones in your pelvis. Backaches may result from overexertion of the muscles supporting your posture.  You may have changes in your hair. These can include thickening of your hair, rapid growth, and changes in texture. Some women also have hair loss during or after pregnancy, or hair that feels dry or thin. Your hair will most likely return to normal after your baby is born.  Your breasts will continue to grow and be tender. A yellow discharge may leak from your breasts called colostrum.  Your belly button may stick out.  You may feel short of breath because of your expanding uterus.  You may notice the fetus "dropping," or moving lower in your abdomen.  You may have a bloody mucus discharge. This usually occurs a few days to a week before labor begins.  Your cervix becomes thin and soft (effaced) near your due date. WHAT TO EXPECT AT YOUR PRENATAL EXAMS  You will have prenatal exams every 2 weeks until week 36. Then, you will have weekly prenatal exams. During a routine  prenatal visit:  You will be weighed to make sure you and the fetus are growing normally.  Your blood pressure is taken.  Your abdomen will be measured to track your baby's growth.  The fetal heartbeat will be listened to.  Any test results from the previous visit will be discussed.  You may have a cervical check near your due date to see if you have effaced. At around 36 weeks, your caregiver will check your cervix. At the same time, your caregiver will also perform a test on the secretions of the vaginal tissue. This test is to determine if a type of bacteria, Group B streptococcus, is present. Your caregiver will explain this further. Your  caregiver may ask you:  What your birth plan is.  How you are feeling.  If you are feeling the baby move.  If you have had any abnormal symptoms, such as leaking fluid, bleeding, severe headaches, or abdominal cramping.  If you have any questions. Other tests or screenings that may be performed during your third trimester include:  Blood tests that check for low iron levels (anemia).  Fetal testing to check the health, activity level, and growth of the fetus. Testing is done if you have certain medical conditions or if there are problems during the pregnancy. FALSE LABOR You may feel small, irregular contractions that eventually go away. These are called Braxton Hicks contractions, or false labor. Contractions may last for hours, days, or even weeks before true labor sets in. If contractions come at regular intervals, intensify, or become painful, it is best to be seen by your caregiver.  SIGNS OF LABOR   Menstrual-like cramps.  Contractions that are 5 minutes apart or less.  Contractions that start on the top of the uterus and spread down to the lower abdomen and back.  A sense of increased pelvic pressure or back pain.  A watery or bloody mucus discharge that comes from the vagina. If you have any of these signs before the 37th week  of pregnancy, call your caregiver right away. You need to go to the hospital to get checked immediately. HOME CARE INSTRUCTIONS   Avoid all smoking, herbs, alcohol, and unprescribed drugs. These chemicals affect the formation and growth of the baby.  Follow your caregiver's instructions regarding medicine use. There are medicines that are either safe or unsafe to take during pregnancy.  Exercise only as directed by your caregiver. Experiencing uterine cramps is a good sign to stop exercising.  Continue to eat regular, healthy meals.  Wear a good support bra for breast tenderness.  Do not use hot tubs, steam rooms, or saunas.  Wear your seat belt at all times when driving.  Avoid raw meat, uncooked cheese, cat litter boxes, and soil used by cats. These carry germs that can cause birth defects in the baby.  Take your prenatal vitamins.  Try taking a stool softener (if your caregiver approves) if you develop constipation. Eat more high-fiber foods, such as fresh vegetables or fruit and whole grains. Drink plenty of fluids to keep your urine clear or pale yellow.  Take warm sitz baths to soothe any pain or discomfort caused by hemorrhoids. Use hemorrhoid cream if your caregiver approves.  If you develop varicose veins, wear support hose. Elevate your feet for 15 minutes, 3-4 times a day. Limit salt in your diet.  Avoid heavy lifting, wear low heal shoes, and practice good posture.  Rest a lot with your legs elevated if you have leg cramps or low back pain.  Visit your dentist if you have not gone during your pregnancy. Use a soft toothbrush to brush your teeth and be gentle when you floss.  A sexual relationship may be continued unless your caregiver directs you otherwise.  Do not travel far distances unless it is absolutely necessary and only with the approval of your caregiver.  Take prenatal classes to understand, practice, and ask questions about the labor and delivery.  Make a  trial run to the hospital.  Pack your hospital bag.  Prepare the baby's nursery.  Continue to go to all your prenatal visits as directed by your caregiver. SEEK MEDICAL CARE IF:  You are unsure if you  are in labor or if your water has broken.  You have dizziness.  You have mild pelvic cramps, pelvic pressure, or nagging pain in your abdominal area.  You have persistent nausea, vomiting, or diarrhea.  You have a bad smelling vaginal discharge.  You have pain with urination. SEEK IMMEDIATE MEDICAL CARE IF:   You have a fever.  You are leaking fluid from your vagina.  You have spotting or bleeding from your vagina.  You have severe abdominal cramping or pain.  You have rapid weight loss or gain.  You have shortness of breath with chest pain.  You notice sudden or extreme swelling of your face, hands, ankles, feet, or legs.  You have not felt your baby move in over an hour.  You have severe headaches that do not go away with medicine.  You have vision changes. Document Released: 01/14/2001 Document Revised: 01/25/2013 Document Reviewed: 03/23/2012 [facility] Patient Information 2015 [place], [place]. This information is not intended to replace advice given to you by your health care provider. Make sure you discuss any questions you have with your health care provider.

## 2018-06-23 LAB — GLUCOSE TOLERANCE, 2 HOURS W/ 1HR
Glucose, 1 hour: 139 mg/dL (ref 65–179)
Glucose, 2 hour: 118 mg/dL (ref 65–152)
Glucose, Fasting: 76 mg/dL (ref 65–91)

## 2018-06-23 LAB — CBC
Hematocrit: 35 % (ref 34.0–46.6)
Hemoglobin: 11.4 g/dL (ref 11.1–15.9)
MCH: 28.4 pg (ref 26.6–33.0)
MCHC: 32.6 g/dL (ref 31.5–35.7)
MCV: 87 fL (ref 79–97)
Platelets: 296 10*3/uL (ref 150–450)
RBC: 4.01 x10E6/uL (ref 3.77–5.28)
RDW: 12.7 % (ref 11.7–15.4)
WBC: 8.5 10*3/uL (ref 3.4–10.8)

## 2018-06-23 LAB — RPR: RPR Ser Ql: NONREACTIVE

## 2018-06-23 LAB — HIV ANTIBODY (ROUTINE TESTING W REFLEX): HIV Screen 4th Generation wRfx: NONREACTIVE

## 2018-06-23 LAB — ANTIBODY SCREEN: Antibody Screen: NEGATIVE

## 2018-07-20 ENCOUNTER — Encounter: Payer: Medicaid Other | Admitting: Women's Health

## 2018-07-20 ENCOUNTER — Other Ambulatory Visit: Payer: Medicaid Other

## 2018-07-28 ENCOUNTER — Other Ambulatory Visit: Payer: Self-pay | Admitting: Women's Health

## 2018-07-28 DIAGNOSIS — O10919 Unspecified pre-existing hypertension complicating pregnancy, unspecified trimester: Secondary | ICD-10-CM

## 2018-07-28 DIAGNOSIS — O0992 Supervision of high risk pregnancy, unspecified, second trimester: Secondary | ICD-10-CM

## 2018-07-29 ENCOUNTER — Ambulatory Visit (INDEPENDENT_AMBULATORY_CARE_PROVIDER_SITE_OTHER): Payer: Medicaid Other | Admitting: Obstetrics and Gynecology

## 2018-07-29 ENCOUNTER — Ambulatory Visit (INDEPENDENT_AMBULATORY_CARE_PROVIDER_SITE_OTHER): Payer: Medicaid Other

## 2018-07-29 ENCOUNTER — Encounter: Payer: Self-pay | Admitting: Obstetrics and Gynecology

## 2018-07-29 ENCOUNTER — Other Ambulatory Visit: Payer: Self-pay

## 2018-07-29 VITALS — BP 142/80 | HR 111 | Wt 219.2 lb

## 2018-07-29 DIAGNOSIS — O10913 Unspecified pre-existing hypertension complicating pregnancy, third trimester: Secondary | ICD-10-CM

## 2018-07-29 DIAGNOSIS — Z331 Pregnant state, incidental: Secondary | ICD-10-CM

## 2018-07-29 DIAGNOSIS — O0990 Supervision of high risk pregnancy, unspecified, unspecified trimester: Secondary | ICD-10-CM

## 2018-07-29 DIAGNOSIS — Z362 Encounter for other antenatal screening follow-up: Secondary | ICD-10-CM | POA: Diagnosis not present

## 2018-07-29 DIAGNOSIS — O10919 Unspecified pre-existing hypertension complicating pregnancy, unspecified trimester: Secondary | ICD-10-CM

## 2018-07-29 DIAGNOSIS — Z3A32 32 weeks gestation of pregnancy: Secondary | ICD-10-CM

## 2018-07-29 DIAGNOSIS — Z1389 Encounter for screening for other disorder: Secondary | ICD-10-CM

## 2018-07-29 DIAGNOSIS — Z3A34 34 weeks gestation of pregnancy: Secondary | ICD-10-CM | POA: Diagnosis not present

## 2018-07-29 DIAGNOSIS — O0992 Supervision of high risk pregnancy, unspecified, second trimester: Secondary | ICD-10-CM

## 2018-07-29 LAB — POCT URINALYSIS DIPSTICK OB
Blood, UA: NEGATIVE
Glucose, UA: NEGATIVE
Leukocytes, UA: NEGATIVE
Nitrite, UA: NEGATIVE

## 2018-07-29 NOTE — Progress Notes (Signed)
[place] 46+2 wks,cephalic,fhr 703 bpm,posterior placenta gr 1,afi 15 cm,normal ovaries bilat,efw 2364 g 44%,BPP 8/8,RI .56,.66,.64=70%

## 2018-07-29 NOTE — Progress Notes (Signed)
Patient ID: Sara HuaSarah Mercer, female   DOB: 1995/06/15, 23 y.o.   MRN: [id number]    [facility] Patient name: Sara Morse MRN [id number]  Date of birth: 1995/06/15 Chief Complaint:   High Risk Gestation (US today)  History of Present Illness:   Sara Morse is a 23 y.o. 572P0010 female at 7661w5d with an Estimated Date of Delivery: 09/18/18 being seen today for ongoing management of a high-risk pregnancy complicated by chronic HTN, no meds Today she reports no complaints. She was not on BP meds before pregnancy either. This is her first child and she is considering childbirth classes. Her partner is excited, supportive   Contractions: Not present. Vag. Bleeding: None.  Movement: Present. denies leaking of fluid.  Review of Systems:   Pertinent items are noted in HPI Denies abnormal vaginal discharge w/ itching/odor/irritation, headaches, visual changes, shortness of breath, chest pain, abdominal pain, severe nausea/vomiting, or problems with urination or bowel movements unless otherwise stated above. Pertinent History Reviewed:  Reviewed past medical,surgical, social, obstetrical and family history.  Reviewed problem list, medications and allergies. Physical Assessment:   Vitals:   07/29/18 1649  BP: (!) 142/80  Pulse: (!) 111  Weight: 219 lb 3.2 oz (99.4 kg)  Body mass index is 37.63 kg/m.           Physical Examination:   General appearance: alert, well appearing, and in no distress and oriented to person, place, and time  Mental status: alert, oriented to person, place, and time, normal mood, behavior, speech, dress, motor activity, and thought processes, affect appropriate to mood  Skin: warm & dry   Extremities: Edema: None    Cardiovascular: normal heart rate noted  Respiratory: normal respiratory effort, no distress  Abdomen: gravid, soft, non-tender  Pelvic: Cervical exam deferred         Fetal Status:     Movement: Present    Fetal Surveillance Testing  today: US bppUS 34+1 wks,cephalic,fhr 152 bpm,posterior placenta gr 1,afi 15 cm,normal ovaries bilat,efw 2364 g 44%,BPP 8/8,RI .56,.66,.64=70%,s/d 2.7=62%   Results for orders placed or performed in visit on 07/29/18 (from the past 24 hour(s))  POC Urinalysis Dipstick OB   Collection Time: 07/29/18  4:50 PM  Result Value Ref Range   Color, UA     Clarity, UA     Glucose, UA Negative Negative   Bilirubin, UA     Ketones, UA 2+    Spec Grav, UA     Blood, UA neg    pH, UA     POC,PROTEIN,UA Trace Negative, Trace, Small (1+), Moderate (2+), Large (3+), 4+   Urobilinogen, UA     Nitrite, UA neg    Leukocytes, UA Negative Negative   Appearance     Odor      Assessment & Plan:  1) High-risk pregnancy  G2P0010 at 4461w5d with an Estimated Date of Delivery: 09/18/18   2) CHTN, no meds specifically reassuring fetal assessment with good BPP, fluid and excellent Doppler flow   3) Depression/anxiety, zoloft 50mg   Meds: No orders of the defined types were placed in this encounter.  Labs/procedures today: none  Treatment Plan:  Growth u/s @  32, 36wks     2x/wk testing nst/sono @ 36wks or weekly BPP   Deliver @ 39wks  Reviewed: Preterm labor symptoms and general obstetric precautions including but not limited to vaginal bleeding, contractions, leaking of fluid and fetal movement were reviewed in detail with the patient. Check bp weekly, let us know  if >140/90.   Follow-up: Return in 4 days (on 08/02/2018) for HROB, NST.  Orders Placed This Encounter  Procedures  . POC Urinalysis Dipstick OB   By signing my name below, I, De Burrs, attest that this documentation has been prepared under the direction and in the presence of Jonnie Kind, MD. Electronically Signed: De Burrs, Medical Scribe. 07/29/18. 5:01 PM.  I personally performed the services described in this documentation, which was SCRIBED in my presence. The recorded information has been reviewed and considered  accurate. It has been edited as necessary during review. Jonnie Kind, MD

## 2018-08-02 ENCOUNTER — Other Ambulatory Visit: Payer: Self-pay

## 2018-08-02 ENCOUNTER — Other Ambulatory Visit: Payer: Medicaid Other | Admitting: Women's Health

## 2018-08-02 ENCOUNTER — Ambulatory Visit (INDEPENDENT_AMBULATORY_CARE_PROVIDER_SITE_OTHER): Payer: Medicaid Other | Admitting: *Deleted

## 2018-08-02 ENCOUNTER — Other Ambulatory Visit: Payer: Medicaid Other

## 2018-08-02 VITALS — BP 146/83 | HR 105 | Wt 220.6 lb

## 2018-08-02 DIAGNOSIS — Z1389 Encounter for screening for other disorder: Secondary | ICD-10-CM

## 2018-08-02 DIAGNOSIS — Z3A33 33 weeks gestation of pregnancy: Secondary | ICD-10-CM

## 2018-08-02 DIAGNOSIS — O10913 Unspecified pre-existing hypertension complicating pregnancy, third trimester: Secondary | ICD-10-CM

## 2018-08-02 DIAGNOSIS — Z331 Pregnant state, incidental: Secondary | ICD-10-CM

## 2018-08-02 DIAGNOSIS — O0990 Supervision of high risk pregnancy, unspecified, unspecified trimester: Secondary | ICD-10-CM

## 2018-08-02 LAB — POCT URINALYSIS DIPSTICK OB
Blood, UA: NEGATIVE
Glucose, UA: NEGATIVE
Ketones, UA: NEGATIVE
Leukocytes, UA: NEGATIVE
Nitrite, UA: NEGATIVE

## 2018-08-05 ENCOUNTER — Ambulatory Visit (INDEPENDENT_AMBULATORY_CARE_PROVIDER_SITE_OTHER): Payer: Medicaid Other | Admitting: Advanced Practice Midwife

## 2018-08-05 ENCOUNTER — Ambulatory Visit: Payer: Medicaid Other | Admitting: *Deleted

## 2018-08-05 ENCOUNTER — Other Ambulatory Visit: Payer: Self-pay

## 2018-08-05 VITALS — BP 148/86 | HR 111 | Wt 222.0 lb

## 2018-08-05 DIAGNOSIS — Z331 Pregnant state, incidental: Secondary | ICD-10-CM

## 2018-08-05 DIAGNOSIS — O0990 Supervision of high risk pregnancy, unspecified, unspecified trimester: Secondary | ICD-10-CM

## 2018-08-05 DIAGNOSIS — Z1389 Encounter for screening for other disorder: Secondary | ICD-10-CM

## 2018-08-05 DIAGNOSIS — Z3A33 33 weeks gestation of pregnancy: Secondary | ICD-10-CM | POA: Diagnosis not present

## 2018-08-05 LAB — POCT URINALYSIS DIPSTICK OB
Blood, UA: NEGATIVE
Glucose, UA: NEGATIVE
Ketones, UA: NEGATIVE
Leukocytes, UA: NEGATIVE
Nitrite, UA: NEGATIVE
POC,PROTEIN,UA: NEGATIVE

## 2018-08-05 NOTE — Patient Instructions (Signed)
Sara Morse, I greatly value your feedback.  If you receive a survey following your visit with [place] today, we appreciate you taking the time to fill it out.  Thanks, Nigel Berthold, CNM   Call the office [phone]) or go to [facility] if:  You begin to have strong, frequent contractions  Your water breaks.  Sometimes it is a big gush of fluid, sometimes it is just a trickle that keeps getting your panties wet or running down your legs  You have vaginal bleeding.  It is normal to have a small amount of spotting if your cervix was checked.   You don't feel your baby moving like normal.  If you don't, get you something to eat and drink and lay down and focus on feeling your baby move.  You should feel at least 10 movements in 2 hours.  If you don't, you should call the office or go to [facility].    Tdap Vaccine  It is recommended that you get the Tdap vaccine during the third trimester of EACH pregnancy to help protect your baby from getting pertussis (whooping cough)  27-36 weeks is the BEST time to do this so that you can pass the protection on to your baby. During pregnancy is better than after pregnancy, but if you are unable to get it during pregnancy it will be offered at the hospital.   You will be offered this vaccine in the office after 27 weeks. If you do not have health insurance, you can get this vaccine at the health department or your family doctor  Everyone who will be around your baby should also be up-to-date on their vaccines. Adults (who are not pregnant) only need 1 dose of Tdap during adulthood.   Third Trimester of Pregnancy The third trimester is from week 29 through week 42, months 7 through 9. The third trimester is a time when the fetus is growing rapidly. At the end of the ninth month, the fetus is about 20 inches in length and weighs 6-10 pounds.  BODY CHANGES Your body goes through many changes during pregnancy. The changes vary from woman to  woman.   Your weight will continue to increase. You can expect to gain 25-35 pounds (11-16 kg) by the end of the pregnancy.  You may begin to get stretch marks on your hips, abdomen, and breasts.  You may urinate more often because the fetus is moving lower into your pelvis and pressing on your bladder.  You may develop or continue to have heartburn as a result of your pregnancy.  You may develop constipation because certain hormones are causing the muscles that push waste through your intestines to slow down.  You may develop hemorrhoids or swollen, bulging veins (varicose veins).  You may have pelvic pain because of the weight gain and pregnancy hormones relaxing your joints between the bones in your pelvis. Backaches may result from overexertion of the muscles supporting your posture.  You may have changes in your hair. These can include thickening of your hair, rapid growth, and changes in texture. Some women also have hair loss during or after pregnancy, or hair that feels dry or thin. Your hair will most likely return to normal after your baby is born.  Your breasts will continue to grow and be tender. A yellow discharge may leak from your breasts called colostrum.  Your belly button may stick out.  You may feel short of breath because of your expanding uterus.  You  may notice the fetus "dropping," or moving lower in your abdomen.  You may have a bloody mucus discharge. This usually occurs a few days to a week before labor begins.  Your cervix becomes thin and soft (effaced) near your due date. WHAT TO EXPECT AT YOUR PRENATAL EXAMS  You will have prenatal exams every 2 weeks until week 36. Then, you will have weekly prenatal exams. During a routine prenatal visit:  You will be weighed to make sure you and the fetus are growing normally.  Your blood pressure is taken.  Your abdomen will be measured to track your baby's growth.  The fetal heartbeat will be listened  to.  Any test results from the previous visit will be discussed.  You may have a cervical check near your due date to see if you have effaced. At around 36 weeks, your caregiver will check your cervix. At the same time, your caregiver will also perform a test on the secretions of the vaginal tissue. This test is to determine if a type of bacteria, Group B streptococcus, is present. Your caregiver will explain this further. Your caregiver may ask you:  What your birth plan is.  How you are feeling.  If you are feeling the baby move.  If you have had any abnormal symptoms, such as leaking fluid, bleeding, severe headaches, or abdominal cramping.  If you have any questions. Other tests or screenings that may be performed during your third trimester include:  Blood tests that check for low iron levels (anemia).  Fetal testing to check the health, activity level, and growth of the fetus. Testing is done if you have certain medical conditions or if there are problems during the pregnancy. FALSE LABOR You may feel small, irregular contractions that eventually go away. These are called Braxton Hicks contractions, or false labor. Contractions may last for hours, days, or even weeks before true labor sets in. If contractions come at regular intervals, intensify, or become painful, it is best to be seen by your caregiver.  SIGNS OF LABOR   Menstrual-like cramps.  Contractions that are 5 minutes apart or less.  Contractions that start on the top of the uterus and spread down to the lower abdomen and back.  A sense of increased pelvic pressure or back pain.  A watery or bloody mucus discharge that comes from the vagina. If you have any of these signs before the 37th week of pregnancy, call your caregiver right away. You need to go to the hospital to get checked immediately. HOME CARE INSTRUCTIONS   Avoid all smoking, herbs, alcohol, and unprescribed drugs. These chemicals affect the  formation and growth of the baby.  Follow your caregiver's instructions regarding medicine use. There are medicines that are either safe or unsafe to take during pregnancy.  Exercise only as directed by your caregiver. Experiencing uterine cramps is a good sign to stop exercising.  Continue to eat regular, healthy meals.  Wear a good support bra for breast tenderness.  Do not use hot tubs, steam rooms, or saunas.  Wear your seat belt at all times when driving.  Avoid raw meat, uncooked cheese, cat litter boxes, and soil used by cats. These carry germs that can cause birth defects in the baby.  Take your prenatal vitamins.  Try taking a stool softener (if your caregiver approves) if you develop constipation. Eat more high-fiber foods, such as fresh vegetables or fruit and whole grains. Drink plenty of fluids to keep your urine clear  or pale yellow.  Take warm sitz baths to soothe any pain or discomfort caused by hemorrhoids. Use hemorrhoid cream if your caregiver approves.  If you develop varicose veins, wear support hose. Elevate your feet for 15 minutes, 3-4 times a day. Limit salt in your diet.  Avoid heavy lifting, wear low heal shoes, and practice good posture.  Rest a lot with your legs elevated if you have leg cramps or low back pain.  Visit your dentist if you have not gone during your pregnancy. Use a soft toothbrush to brush your teeth and be gentle when you floss.  A sexual relationship may be continued unless your caregiver directs you otherwise.  Do not travel far distances unless it is absolutely necessary and only with the approval of your caregiver.  Take prenatal classes to understand, practice, and ask questions about the labor and delivery.  Make a trial run to the hospital.  Pack your hospital bag.  Prepare the baby's nursery.  Continue to go to all your prenatal visits as directed by your caregiver. SEEK MEDICAL CARE IF:  You are unsure if you are in  labor or if your water has broken.  You have dizziness.  You have mild pelvic cramps, pelvic pressure, or nagging pain in your abdominal area.  You have persistent nausea, vomiting, or diarrhea.  You have a bad smelling vaginal discharge.  You have pain with urination. SEEK IMMEDIATE MEDICAL CARE IF:   You have a fever.  You are leaking fluid from your vagina.  You have spotting or bleeding from your vagina.  You have severe abdominal cramping or pain.  You have rapid weight loss or gain.  You have shortness of breath with chest pain.  You notice sudden or extreme swelling of your face, hands, ankles, feet, or legs.  You have not felt your baby move in over an hour.  You have severe headaches that do not go away with medicine.  You have vision changes. Document Released: 01/14/2001 Document Revised: 01/25/2013 Document Reviewed: 03/23/2012 [facility] Patient Information 2015 [place], [place]. This information is not intended to replace advice given to you by your health care provider. Make sure you discuss any questions you have with your health care provider.

## 2018-08-05 NOTE — Progress Notes (Signed)
HIGH-RISK PREGNANCY VISIT Patient name: Sara Morse Hindle MRN [id number]  Date of birth: 1995-05-15 Chief Complaint:   High Risk Gestation (NST)  History of Present Illness:   Sara Morse Netter is a 23 y.o. 592P0010 female at 5068w5d with an Estimated Date of Delivery: 09/18/18 being seen today for ongoing management of a high-risk pregnancy complicated by chronic HTN.  Today she reports no complaints.  .  .   . denies leaking of fluid.  Review of Systems:   Pertinent items are noted in HPI Denies abnormal vaginal discharge w/ itching/odor/irritation, headaches, visual changes, shortness of breath, chest pain, abdominal pain, severe nausea/vomiting, or problems with urination or bowel movements unless otherwise stated above.    Pertinent History Reviewed:  Medical & Surgical Hx:   Past Medical History:  Diagnosis Date  . Anxiety   . Mental disorder    Past Surgical History:  Procedure Laterality Date  . DENTAL SURGERY     Family History  Problem Relation Age of Onset  . Cancer Paternal Grandfather   . Breast cancer Paternal Grandmother   . Other Maternal Grandmother        brain cancer  . Cancer Maternal Grandfather     Current Outpatient Medications:  .  Prenatal Vit-Iron Carbonyl-FA (PRENATAL PLUS IRON) 29-1 MG TABS, Take 1 tablet by mouth daily., Disp: 30 tablet, Rfl: 12 .  sertraline (ZOLOFT) 50 MG tablet, Take 1 tablet (50 mg total) by mouth daily., Disp: 30 tablet, Rfl: 6 Social History: Reviewed -  reports that she has never smoked. She has never used smokeless tobacco.  BPs at home : 130's/70's Appetite is better now, zoloft has helped.   Physical Assessment:  There were no vitals filed for this visit.There is no height or weight on file to calculate BMI.     148/86      Physical Examination:   General appearance: alert, well appearing, and in no distress  Mental status: alert, oriented to person, place, and time  Skin: warm & dry   Extremities:      Cardiovascular:  normal heart rate noted  Respiratory: normal respiratory effort, no distress  Abdomen: gravid, soft, non-tender  Pelvic: Cervical exam deferred         Fetal Status:          Fetal Surveillance Testing today: NST: FHR baseline 140 bpm, Variability: moderate, Accelerations:present, Decelerations:  Absent= Cat 1/Reactive   Results for orders placed or performed in visit on 08/05/18 (from the past 24 hour(s))  POC Urinalysis Dipstick OB   Collection Time: 08/05/18  3:06 PM  Result Value Ref Range   Color, UA     Clarity, UA     Glucose, UA Negative Negative   Bilirubin, UA     Ketones, UA neg    Spec Grav, UA     Blood, UA neg    pH, UA     POC,PROTEIN,UA Negative Negative, Trace, Small (1+), Moderate (2+), Large (3+), 4+   Urobilinogen, UA     Nitrite, UA neg    Leukocytes, UA Negative Negative   Appearance     Odor      Assessment & Plan:  1) High-risk pregnancy G2P0010 at 11068w5d with an Estimated Date of Delivery: 09/18/18   2) CHTN, no meds.  Treatment Plan:  Weekly BPP  3) depression, stable.  Treatment Plan:  Continue zoloft at 50mg  daily  Labs/procedures today:    Follow-up: Return for start weekly BPP/HROB on Tuesdays. .  Future Appointments  Date Time Provider [place]  08/10/2018 11:45 AM [facility] [place] [facility] None  08/10/2018 12:15 PM Florian Buff, MD [facility]  08/17/2018  2:45 PM [place] - [facility] [place] [facility] None  08/17/2018  3:45 PM Florian Buff, MD [facility]  08/31/2018 11:45 AM [facility] [place] [facility] None  08/31/2018 12:15 PM Florian Buff, MD [facility]  09/07/2018  2:00 PM [facility] [place] [facility] None  09/07/2018  2:45 PM Roma Schanz, CNM [facility]  09/14/2018 10:30 AM [facility] [place] [facility] None  09/14/2018 11:15 AM Roma Schanz, CNM [facility]    No orders of the defined types were placed in this encounter.  Christin Fudge CNM 08/05/2018 7:50 PM

## 2018-08-09 ENCOUNTER — Other Ambulatory Visit: Payer: Self-pay | Admitting: Advanced Practice Midwife

## 2018-08-09 DIAGNOSIS — O10919 Unspecified pre-existing hypertension complicating pregnancy, unspecified trimester: Secondary | ICD-10-CM

## 2018-08-10 ENCOUNTER — Encounter: Payer: Self-pay | Admitting: Obstetrics & Gynecology

## 2018-08-10 ENCOUNTER — Other Ambulatory Visit: Payer: Self-pay

## 2018-08-10 ENCOUNTER — Ambulatory Visit (INDEPENDENT_AMBULATORY_CARE_PROVIDER_SITE_OTHER): Payer: Medicaid Other | Admitting: Obstetrics & Gynecology

## 2018-08-10 ENCOUNTER — Ambulatory Visit (INDEPENDENT_AMBULATORY_CARE_PROVIDER_SITE_OTHER): Payer: Medicaid Other

## 2018-08-10 VITALS — BP 114/72 | HR 102 | Wt 223.0 lb

## 2018-08-10 DIAGNOSIS — O0990 Supervision of high risk pregnancy, unspecified, unspecified trimester: Secondary | ICD-10-CM

## 2018-08-10 DIAGNOSIS — Z3A34 34 weeks gestation of pregnancy: Secondary | ICD-10-CM

## 2018-08-10 DIAGNOSIS — O10913 Unspecified pre-existing hypertension complicating pregnancy, third trimester: Secondary | ICD-10-CM

## 2018-08-10 DIAGNOSIS — Z331 Pregnant state, incidental: Secondary | ICD-10-CM

## 2018-08-10 DIAGNOSIS — Z1389 Encounter for screening for other disorder: Secondary | ICD-10-CM

## 2018-08-10 DIAGNOSIS — O0993 Supervision of high risk pregnancy, unspecified, third trimester: Secondary | ICD-10-CM

## 2018-08-10 DIAGNOSIS — O10919 Unspecified pre-existing hypertension complicating pregnancy, unspecified trimester: Secondary | ICD-10-CM

## 2018-08-10 LAB — POCT URINALYSIS DIPSTICK OB
Glucose, UA: NEGATIVE
Ketones, UA: NEGATIVE
Leukocytes, UA: NEGATIVE
Nitrite, UA: NEGATIVE
POC,PROTEIN,UA: NEGATIVE

## 2018-08-10 NOTE — Progress Notes (Signed)
[place] 04+5 wks,cephalic,BPP 9/9,HFSFSELTR placenta gr 2,fhr 150 bpm,afi 16.8 cm,RI .58,.61,.59=54%

## 2018-08-10 NOTE — Progress Notes (Signed)
   HIGH-RISK PREGNANCY VISIT Patient name: Sara Morse MRN [id number]  Date of birth: [birth year] Chief Complaint:   High Risk Gestation ([place] today)  History of Present Illness:   Sara Morse is a 23 y.o. G85P0010 female at [redacted]w[redacted]d with an Estimated Date of Delivery: 09/18/18 being seen today for ongoing management of a high-risk pregnancy complicated by chronic HTN.  Today she reports no complaints. Contractions: Not present. Vag. Bleeding: None.  Movement: Present. denies leaking of fluid.  Review of Systems:   Pertinent items are noted in HPI Denies abnormal vaginal discharge w/ itching/odor/irritation, headaches, visual changes, shortness of breath, chest pain, abdominal pain, severe nausea/vomiting, or problems with urination or bowel movements unless otherwise stated above. Pertinent History Reviewed:  Reviewed past medical,surgical, social, obstetrical and family history.  Reviewed problem list, medications and allergies. Physical Assessment:   Vitals:   08/10/18 1217  BP: 114/72  Pulse: (!) 102  Weight: 223 lb (101.2 kg)  Body mass index is 38.28 kg/m.           Physical Examination:   General appearance: alert, well appearing, and in no distress  Mental status: alert, oriented to person, place, and time  Skin: warm & dry   Extremities: Edema: Trace    Cardiovascular: normal heart rate noted  Respiratory: normal respiratory effort, no distress  Abdomen: gravid, soft, non-tender  Pelvic: Cervical exam deferred         Fetal Status:     Movement: Present    Fetal Surveillance Testing today: BPP 8/8 with excellendt Doppler flow   Results for orders placed or performed in visit on 08/10/18 (from the past 24 hour(s))  POC Urinalysis Dipstick OB   Collection Time: 08/10/18 12:19 PM  Result Value Ref Range   Color, UA     Clarity, UA     Glucose, UA Negative Negative   Bilirubin, UA     Ketones, UA neg    Spec Grav, UA     Blood, UA trace    pH, UA     POC,PROTEIN,UA  Negative Negative, Trace, Small (1+), Moderate (2+), Large (3+), 4+   Urobilinogen, UA     Nitrite, UA neg    Leukocytes, UA Negative Negative   Appearance     Odor      Assessment & Plan:  1) High-risk pregnancy G2P0010 at [redacted]w[redacted]d with an Estimated Date of Delivery: 09/18/18   2) CHTN, stable, no meds    Meds: No orders of the defined types were placed in this encounter.   Labs/procedures today: BPP 8/8  Treatment Plan:  Weekly BPP with history of hypertension no meds, IOL 39 wks or as indicated  Reviewed: Preterm labor symptoms and general obstetric precautions including but not limited to vaginal bleeding, contractions, leaking of fluid and fetal movement were reviewed in detail with the patient.  All questions were answered.  home bp cuff. Rx faxed to . Check bp weekly, let us know if >140/90.   Follow-up: No follow-ups on file.  Orders Placed This Encounter  Procedures  . POC Urinalysis Dipstick OB   Florian Buff  08/10/2018 12:23 PM

## 2018-08-16 ENCOUNTER — Other Ambulatory Visit: Payer: Self-pay | Admitting: Obstetrics & Gynecology

## 2018-08-16 DIAGNOSIS — O10919 Unspecified pre-existing hypertension complicating pregnancy, unspecified trimester: Secondary | ICD-10-CM

## 2018-08-17 ENCOUNTER — Ambulatory Visit (INDEPENDENT_AMBULATORY_CARE_PROVIDER_SITE_OTHER): Payer: Medicaid Other

## 2018-08-17 ENCOUNTER — Other Ambulatory Visit: Payer: Self-pay

## 2018-08-17 ENCOUNTER — Ambulatory Visit (INDEPENDENT_AMBULATORY_CARE_PROVIDER_SITE_OTHER): Payer: Medicaid Other | Admitting: Obstetrics & Gynecology

## 2018-08-17 VITALS — BP 133/81 | HR 99 | Wt 226.0 lb

## 2018-08-17 DIAGNOSIS — O10919 Unspecified pre-existing hypertension complicating pregnancy, unspecified trimester: Secondary | ICD-10-CM

## 2018-08-17 DIAGNOSIS — O10913 Unspecified pre-existing hypertension complicating pregnancy, third trimester: Secondary | ICD-10-CM | POA: Diagnosis not present

## 2018-08-17 DIAGNOSIS — Z1389 Encounter for screening for other disorder: Secondary | ICD-10-CM

## 2018-08-17 DIAGNOSIS — Z331 Pregnant state, incidental: Secondary | ICD-10-CM

## 2018-08-17 DIAGNOSIS — Z3A35 35 weeks gestation of pregnancy: Secondary | ICD-10-CM

## 2018-08-17 DIAGNOSIS — O0993 Supervision of high risk pregnancy, unspecified, third trimester: Secondary | ICD-10-CM

## 2018-08-17 DIAGNOSIS — O0990 Supervision of high risk pregnancy, unspecified, unspecified trimester: Secondary | ICD-10-CM

## 2018-08-17 LAB — POCT URINALYSIS DIPSTICK OB
Blood, UA: NEGATIVE
Glucose, UA: NEGATIVE
Ketones, UA: NEGATIVE
Leukocytes, UA: NEGATIVE
Nitrite, UA: NEGATIVE
POC,PROTEIN,UA: NEGATIVE

## 2018-08-17 NOTE — Progress Notes (Signed)
   HIGH-RISK PREGNANCY VISIT Patient name: Sara Morse MRN [id number]  Date of birth: July 26, 1995 Chief Complaint:   Routine Prenatal Visit ([place] today)  History of Present Illness:   Sara Morse is a 23 y.o. G36P0010 female at [redacted]w[redacted]d with an Estimated Date of Delivery: 09/18/18 being seen today for ongoing management of a high-risk pregnancy complicated by chronic HTN. No meds Today she reports no complaints. Contractions: Not present. Vag. Bleeding: None.  Movement: Present. denies leaking of fluid.  Review of Systems:   Pertinent items are noted in HPI Denies abnormal vaginal discharge w/ itching/odor/irritation, headaches, visual changes, shortness of breath, chest pain, abdominal pain, severe nausea/vomiting, or problems with urination or bowel movements unless otherwise stated above. Pertinent History Reviewed:  Reviewed past medical,surgical, social, obstetrical and family history.  Reviewed problem list, medications and allergies. Physical Assessment:   Vitals:   08/17/18 1519  BP: 133/81  Pulse: 99  Weight: 226 lb (102.5 kg)  Body mass index is 38.79 kg/m.           Physical Examination:   General appearance: alert, well appearing, and in no distress  Mental status: alert, oriented to person, place, and time  Skin: warm & dry   Extremities: Edema: Trace    Cardiovascular: normal heart rate noted  Respiratory: normal respiratory effort, no distress  Abdomen: gravid, soft, non-tender  Pelvic: Cervical exam deferred         Fetal Status:     Movement: Present    Fetal Surveillance Testing today: BPP 8/8 with normal Doppler ratios   Results for orders placed or performed in visit on 08/17/18 (from the past 24 hour(s))  POC Urinalysis Dipstick OB   Collection Time: 08/17/18  3:22 PM  Result Value Ref Range   Color, UA     Clarity, UA     Glucose, UA Negative Negative   Bilirubin, UA     Ketones, UA neg    Spec Grav, UA     Blood, UA neg    pH, UA     POC,PROTEIN,UA Negative Negative, Trace, Small (1+), Moderate (2+), Large (3+), 4+   Urobilinogen, UA     Nitrite, UA neg    Leukocytes, UA Negative Negative   Appearance     Odor      Assessment & Plan:  1) High-risk pregnancy G2P0010 at [redacted]w[redacted]d with an Estimated Date of Delivery: 09/18/18   2) CHTN, stable, no meds, weekly BPP IOL 40 weeks    Meds: No orders of the defined types were placed in this encounter.   Labs/procedures today:   Treatment Plan:  See above  Reviewed: Term labor symptoms and general obstetric precautions including but not limited to vaginal bleeding, contractions, leaking of fluid and fetal movement were reviewed in detail with the patient.  All questions were answered.  home bp cuff. Rx faxed to . Check bp weekly, let us know if >140/90.   Follow-up: Return in about 1 week (around 08/24/2018) for BPP/sono, HROB.  Orders Placed This Encounter  Procedures  . POC Urinalysis Dipstick OB   Florian Buff CNM, [facility] 08/17/2018 4:13 PM

## 2018-08-17 NOTE — Progress Notes (Signed)
[place] 90+3 wks,cephalic,posterior placenta gr 2,afi 12.3 cm,fhr 150 bpm,RI .74,.48,.54=61%

## 2018-08-18 ENCOUNTER — Telehealth: Payer: Self-pay | Admitting: Obstetrics & Gynecology

## 2018-08-18 NOTE — Telephone Encounter (Signed)
Patient called, she is scheduled for BPP and ultrasounds every week starting 7/28.  She is wanted to know if she needs any kind of appointments next Tuesday?    [phone]

## 2018-08-20 ENCOUNTER — Telehealth: Payer: Self-pay | Admitting: *Deleted

## 2018-08-20 NOTE — Telephone Encounter (Signed)
Spoke with patient and informed MFM does not have ultrasound appointments next week.  Discussed with Dr Elonda Husky and she will need to come in for 2 NST's next along with a visit with a provider.  Pt verbalized understanding and appts made.

## 2018-08-24 ENCOUNTER — Ambulatory Visit (INDEPENDENT_AMBULATORY_CARE_PROVIDER_SITE_OTHER): Payer: Medicaid Other | Admitting: Obstetrics and Gynecology

## 2018-08-24 ENCOUNTER — Other Ambulatory Visit: Payer: Self-pay

## 2018-08-24 ENCOUNTER — Encounter: Payer: Self-pay | Admitting: Obstetrics and Gynecology

## 2018-08-24 VITALS — BP 122/80 | HR 107 | Wt 222.8 lb

## 2018-08-24 DIAGNOSIS — O0990 Supervision of high risk pregnancy, unspecified, unspecified trimester: Secondary | ICD-10-CM

## 2018-08-24 DIAGNOSIS — Z3483 Encounter for supervision of other normal pregnancy, third trimester: Secondary | ICD-10-CM

## 2018-08-24 DIAGNOSIS — O0993 Supervision of high risk pregnancy, unspecified, third trimester: Secondary | ICD-10-CM | POA: Diagnosis not present

## 2018-08-24 DIAGNOSIS — Z331 Pregnant state, incidental: Secondary | ICD-10-CM

## 2018-08-24 DIAGNOSIS — O10913 Unspecified pre-existing hypertension complicating pregnancy, third trimester: Secondary | ICD-10-CM

## 2018-08-24 DIAGNOSIS — Z1389 Encounter for screening for other disorder: Secondary | ICD-10-CM

## 2018-08-24 DIAGNOSIS — Z3A36 36 weeks gestation of pregnancy: Secondary | ICD-10-CM

## 2018-08-24 LAB — POCT URINALYSIS DIPSTICK OB
Blood, UA: NEGATIVE
Glucose, UA: NEGATIVE
Ketones, UA: NEGATIVE
Leukocytes, UA: NEGATIVE
Nitrite, UA: NEGATIVE
POC,PROTEIN,UA: NEGATIVE

## 2018-08-24 NOTE — Progress Notes (Signed)
Patient ID: Sara Morse, female   DOB: 1995-02-20, 23 y.o.   MRN: [id number]    [facility] PREGNANCY VISIT Patient name: Sara Morse MRN [id number]  Date of birth: [birth year] Chief Complaint:   High Risk Gestation (gbs-gc-chl/ room # 12)  History of Present Illness:   Sara Morse is a 23 y.o. G73P0010 female at [redacted]w[redacted]d with an Estimated Date of Delivery: 09/18/18 being seen today for ongoing management of a high-risk pregnancy complicated by chronic HTN. Denies blurry vision, headache. Today she reports no complaints. Contractions: Not present.  .  Movement: Present. denies leaking of fluid.  Review of Systems:   Pertinent items are noted in HPI Denies abnormal vaginal discharge w/ itching/odor/irritation, headaches, visual changes, shortness of breath, chest pain, abdominal pain, severe nausea/vomiting, or problems with urination or bowel movements unless otherwise stated above. Pertinent History Reviewed:  Reviewed past medical,surgical, social, obstetrical and family history.  Reviewed problem list, medications and allergies. Physical Assessment:   Vitals:   08/24/18 0906  BP: (!) 152/90  Pulse: (!) 107  Weight: 222 lb 12.8 oz (101.1 kg)  Body mass index is 38.24 kg/m.           Physical Examination: BP recheck 122/80  General appearance: alert, well appearing, and in no distress  Mental status: alert, oriented to person, place, and time, normal mood, behavior, speech, dress, motor activity, and thought processes, affect appropriate to mood  Skin: warm & dry   Extremities: Edema: Trace    Cardiovascular: normal heart rate noted  Respiratory: normal respiratory effort, no distress  Abdomen: gravid, soft, non-tender  Pelvic: Cervical exam performed mid position         Fetal Status:     Movement: Present    Fetal Surveillance Testing today: NST   Results for orders placed or performed in visit on 08/24/18 (from the past 24 hour(s))  POC Urinalysis Dipstick OB   Collection  Time: 08/24/18  9:14 AM  Result Value Ref Range   Color, UA     Clarity, UA     Glucose, UA Negative Negative   Bilirubin, UA     Ketones, UA neg    Spec Grav, UA     Blood, UA neg    pH, UA     POC,PROTEIN,UA Negative Negative, Trace, Small (1+), Moderate (2+), Large (3+), 4+   Urobilinogen, UA     Nitrite, UA neg    Leukocytes, UA Negative Negative   Appearance     Odor      Assessment & Plan:  1) High-risk pregnancy G2P0010 at [redacted]w[redacted]d with an Estimated Date of Delivery: 09/18/18   2) CHTN, no meds   Meds: No orders of the defined types were placed in this encounter.   Labs/procedures today: NST, GBS/GC/ CHL  Treatment Plan:  IOL @ 40 weeks or as indicated if condition changes.  Reviewed: Preterm labor symptoms and general obstetric precautions including but not limited to vaginal bleeding, contractions, leaking of fluid and fetal movement were reviewed in detail with the patient.  All questions were answered.   Follow-up: Return in about 3 days (around 08/27/2018). for NST.  Orders Placed This Encounter  Procedures  . Strep Gp B NAA  . GC/Chlamydia Probe Amp  . POC Urinalysis Dipstick OB   By signing my name below, I, Samul Dada, attest that this documentation has been prepared under the direction and in the presence of Jonnie Kind, MD. Electronically Signed: Frannie. 08/24/18. 10:35 AM.  I personally performed the services described in this documentation, which was SCRIBED in my presence. The recorded information has been reviewed and considered accurate. It has been edited as necessary during review. Jonnie Kind, MD

## 2018-08-26 LAB — STREP GP B NAA: Strep Gp B NAA: NEGATIVE

## 2018-08-27 ENCOUNTER — Ambulatory Visit (INDEPENDENT_AMBULATORY_CARE_PROVIDER_SITE_OTHER): Payer: Medicaid Other | Admitting: Obstetrics and Gynecology

## 2018-08-27 ENCOUNTER — Encounter: Payer: Self-pay | Admitting: *Deleted

## 2018-08-27 ENCOUNTER — Other Ambulatory Visit: Payer: Self-pay

## 2018-08-27 VITALS — BP 132/83 | HR 103 | Wt 227.0 lb

## 2018-08-27 DIAGNOSIS — Z1389 Encounter for screening for other disorder: Secondary | ICD-10-CM

## 2018-08-27 DIAGNOSIS — O10913 Unspecified pre-existing hypertension complicating pregnancy, third trimester: Secondary | ICD-10-CM

## 2018-08-27 DIAGNOSIS — Z331 Pregnant state, incidental: Secondary | ICD-10-CM

## 2018-08-27 DIAGNOSIS — O0990 Supervision of high risk pregnancy, unspecified, unspecified trimester: Secondary | ICD-10-CM

## 2018-08-27 LAB — POCT URINALYSIS DIPSTICK OB
Blood, UA: NEGATIVE
Glucose, UA: NEGATIVE
Ketones, UA: NEGATIVE
Leukocytes, UA: NEGATIVE
Nitrite, UA: NEGATIVE
POC,PROTEIN,UA: NEGATIVE

## 2018-08-28 LAB — GC/CHLAMYDIA PROBE AMP
Chlamydia trachomatis, NAA: NEGATIVE
Neisseria Gonorrhoeae by PCR: NEGATIVE

## 2018-08-30 ENCOUNTER — Other Ambulatory Visit: Payer: Self-pay | Admitting: Obstetrics and Gynecology

## 2018-08-30 DIAGNOSIS — O10919 Unspecified pre-existing hypertension complicating pregnancy, unspecified trimester: Secondary | ICD-10-CM

## 2018-08-31 ENCOUNTER — Ambulatory Visit (INDEPENDENT_AMBULATORY_CARE_PROVIDER_SITE_OTHER): Payer: Medicaid Other

## 2018-08-31 ENCOUNTER — Encounter: Payer: Self-pay | Admitting: Obstetrics & Gynecology

## 2018-08-31 ENCOUNTER — Ambulatory Visit (INDEPENDENT_AMBULATORY_CARE_PROVIDER_SITE_OTHER): Payer: Medicaid Other | Admitting: Obstetrics & Gynecology

## 2018-08-31 ENCOUNTER — Other Ambulatory Visit: Payer: Self-pay

## 2018-08-31 VITALS — BP 138/80 | HR 100 | Wt 224.0 lb

## 2018-08-31 DIAGNOSIS — Z3A37 37 weeks gestation of pregnancy: Secondary | ICD-10-CM

## 2018-08-31 DIAGNOSIS — O10919 Unspecified pre-existing hypertension complicating pregnancy, unspecified trimester: Secondary | ICD-10-CM

## 2018-08-31 DIAGNOSIS — Z1389 Encounter for screening for other disorder: Secondary | ICD-10-CM

## 2018-08-31 DIAGNOSIS — O0993 Supervision of high risk pregnancy, unspecified, third trimester: Secondary | ICD-10-CM

## 2018-08-31 DIAGNOSIS — O10913 Unspecified pre-existing hypertension complicating pregnancy, third trimester: Secondary | ICD-10-CM

## 2018-08-31 DIAGNOSIS — Z331 Pregnant state, incidental: Secondary | ICD-10-CM

## 2018-08-31 DIAGNOSIS — Z362 Encounter for other antenatal screening follow-up: Secondary | ICD-10-CM | POA: Diagnosis not present

## 2018-08-31 DIAGNOSIS — O0990 Supervision of high risk pregnancy, unspecified, unspecified trimester: Secondary | ICD-10-CM

## 2018-08-31 LAB — POCT URINALYSIS DIPSTICK OB
Blood, UA: NEGATIVE
Glucose, UA: NEGATIVE
Leukocytes, UA: NEGATIVE
Nitrite, UA: NEGATIVE
POC,PROTEIN,UA: NEGATIVE

## 2018-08-31 NOTE — Progress Notes (Signed)
   HIGH-RISK PREGNANCY VISIT Patient name: Sara Morse MRN [id number]  Date of birth: 05-07-1995 Chief Complaint:   High Risk Gestation (U/S)  History of Present Illness:   Sara Morse is a 23 y.o. G34P0010 female at [redacted]w[redacted]d with an Estimated Date of Delivery: 09/18/18 being seen today for ongoing management of a high-risk pregnancy complicated by [facility] currently on no meds Today she reports no complaints. Contractions: Regular.  .  Movement: Present. denies leaking of fluid.  Review of Systems:   Pertinent items are noted in HPI Denies abnormal vaginal discharge w/ itching/odor/irritation, headaches, visual changes, shortness of breath, chest pain, abdominal pain, severe nausea/vomiting, or problems with urination or bowel movements unless otherwise stated above. Pertinent History Reviewed:  Reviewed past medical,surgical, social, obstetrical and family history.  Reviewed problem list, medications and allergies. Physical Assessment:   Vitals:   08/31/18 1227  BP: 138/80  Pulse: 100  Weight: 224 lb (101.6 kg)  Body mass index is 38.45 kg/m.           Physical Examination:   General appearance: alert, well appearing, and in no distress  Mental status: alert, oriented to person, place, and time  Skin: warm & dry   Extremities: Edema: Trace    Cardiovascular: normal heart rate noted  Respiratory: normal respiratory effort, no distress  Abdomen: gravid, soft, non-tender  Pelvic: Cervical exam deferred         Fetal Status:     Movement: Present    Fetal Surveillance Testing today: BPP 8/8 with normal Dopplers   Results for orders placed or performed in visit on 08/31/18 (from the past 24 hour(s))  POC Urinalysis Dipstick OB   Collection Time: 08/31/18 12:32 PM  Result Value Ref Range   Color, UA     Clarity, UA     Glucose, UA Negative Negative   Bilirubin, UA     Ketones, UA small    Spec Grav, UA     Blood, UA neg    pH, UA     POC,PROTEIN,UA Negative Negative, Trace,  Small (1+), Moderate (2+), Large (3+), 4+   Urobilinogen, UA     Nitrite, UA neg    Leukocytes, UA Negative Negative   Appearance     Odor      Assessment & Plan:  1) High-risk pregnancy G2P0010 at [redacted]w[redacted]d with an Estimated Date of Delivery: 09/18/18   2) CHTN, stable  3) Suspected fetal macrosomia, stable, EFW 40 wks ----> 4550 grams [place]  Meds: No orders of the defined types were placed in this encounter.   Labs/procedures today: BPP 8/8  Treatment Plan:  IOL 40 weeks, will depend on cervical exam etc given EFW, not diabetic  Reviewed: Term labor symptoms and general obstetric precautions including but not limited to vaginal bleeding, contractions, leaking of fluid and fetal movement were reviewed in detail with the patient.  All questions were answered.  home bp cuff. Rx faxed to . Check bp weekly, let us know if >140/90.   Follow-up: Return in about 1 week (around 09/07/2018) for BPP/sono, HROB.  Orders Placed This Encounter  Procedures  . POC Urinalysis Dipstick OB   Florian Buff  08/31/2018 12:44 PM

## 2018-08-31 NOTE — Progress Notes (Addendum)
[place] 15+4 wks,cephalic,posterior placenta gr 3,afi 14 cm,normal ovaries bilat,RI .57,.51,.50,.54=28%,EFW 3928 g 97.5%,BPP 8/8

## 2018-09-03 NOTE — Progress Notes (Signed)
NST reactive Baseline 150 with accels. No contractions or decels. Continue biweekly testing.

## 2018-09-06 ENCOUNTER — Other Ambulatory Visit: Payer: Self-pay | Admitting: Obstetrics & Gynecology

## 2018-09-06 DIAGNOSIS — O10919 Unspecified pre-existing hypertension complicating pregnancy, unspecified trimester: Secondary | ICD-10-CM

## 2018-09-07 ENCOUNTER — Ambulatory Visit (INDEPENDENT_AMBULATORY_CARE_PROVIDER_SITE_OTHER): Payer: Medicaid Other

## 2018-09-07 ENCOUNTER — Encounter: Payer: Self-pay | Admitting: Women's Health

## 2018-09-07 ENCOUNTER — Ambulatory Visit (INDEPENDENT_AMBULATORY_CARE_PROVIDER_SITE_OTHER): Payer: Medicaid Other | Admitting: Women's Health

## 2018-09-07 ENCOUNTER — Other Ambulatory Visit: Payer: Self-pay

## 2018-09-07 VITALS — BP 139/85 | HR 95 | Wt 224.0 lb

## 2018-09-07 DIAGNOSIS — Z3A38 38 weeks gestation of pregnancy: Secondary | ICD-10-CM

## 2018-09-07 DIAGNOSIS — O10919 Unspecified pre-existing hypertension complicating pregnancy, unspecified trimester: Secondary | ICD-10-CM

## 2018-09-07 DIAGNOSIS — O10913 Unspecified pre-existing hypertension complicating pregnancy, third trimester: Secondary | ICD-10-CM

## 2018-09-07 DIAGNOSIS — Z331 Pregnant state, incidental: Secondary | ICD-10-CM

## 2018-09-07 DIAGNOSIS — O0993 Supervision of high risk pregnancy, unspecified, third trimester: Secondary | ICD-10-CM

## 2018-09-07 DIAGNOSIS — O1213 Gestational proteinuria, third trimester: Secondary | ICD-10-CM

## 2018-09-07 DIAGNOSIS — Z1389 Encounter for screening for other disorder: Secondary | ICD-10-CM

## 2018-09-07 LAB — POCT URINALYSIS DIPSTICK OB
Blood, UA: NEGATIVE
Glucose, UA: NEGATIVE
Ketones, UA: NEGATIVE
Leukocytes, UA: NEGATIVE
Nitrite, UA: NEGATIVE

## 2018-09-07 NOTE — Progress Notes (Signed)
   HIGH-RISK PREGNANCY VISIT Patient name: Sara Morse MRN [id number]  Date of birth: 01-06-1996 Chief Complaint:   Routine Prenatal Visit ([place])  History of Present Illness:   Sara Morse is a 23 y.o. G57P0010 female at [redacted]w[redacted]d with an Estimated Date of Delivery: 09/18/18 being seen today for ongoing management of a high-risk pregnancy complicated by [facility] currently on no meds, suspected LGA EFW 97% @ 37wks Today she reports no complaints. Denies ha, visual changes, ruq/epigastric pain, n/v.   Contractions: Not present. Vag. Bleeding: None.  Movement: Present. denies leaking of fluid.  Review of Systems:   Pertinent items are noted in HPI Denies abnormal vaginal discharge w/ itching/odor/irritation, headaches, visual changes, shortness of breath, chest pain, abdominal pain, severe nausea/vomiting, or problems with urination or bowel movements unless otherwise stated above. Pertinent History Reviewed:  Reviewed past medical,surgical, social, obstetrical and family history.  Reviewed problem list, medications and allergies. Physical Assessment:   Vitals:   09/07/18 1431  BP: 139/85  Pulse: 95  Weight: 224 lb (101.6 kg)  Body mass index is 38.45 kg/m.           Physical Examination:   General appearance: alert, well appearing, and in no distress  Mental status: alert, oriented to person, place, and time  Skin: warm & dry   Extremities: Edema: Trace, DTRs 2+, no clonus   Cardiovascular: normal heart rate noted  Respiratory: normal respiratory effort, no distress  Abdomen: gravid, soft, non-tender  Pelvic: Cervical exam performed  Dilation: Closed Effacement (%): 50 Station: -3  Fetal Status: Fetal Heart Rate (bpm): 166 u/s Fundal Height: 39 cm Movement: Present Presentation: Vertex  Fetal Surveillance Testing today:US 09+7 wks,cephalic,BPP 3/5,HGDJMEQAS placenta gr 3,fhr 166 bpm,afi 13.5 cm,RI .50,.62,.52=51%   Results for orders placed or performed in visit on 09/07/18 (from the  past 24 hour(s))  POC Urinalysis Dipstick OB   Collection Time: 09/07/18  2:35 PM  Result Value Ref Range   Color, UA     Clarity, UA     Glucose, UA Negative Negative   Bilirubin, UA     Ketones, UA neg    Spec Grav, UA     Blood, UA neg    pH, UA     POC,PROTEIN,UA Small (1+) Negative, Trace, Small (1+), Moderate (2+), Large (3+), 4+   Urobilinogen, UA     Nitrite, UA neg    Leukocytes, UA Negative Negative   Appearance     Odor      Assessment & Plan:  1) High-risk pregnancy G2P0010 at [redacted]w[redacted]d with an Estimated Date of Delivery: 09/18/18   2) CHTN no meds, stable, has new proteinuria today, asymptomatic, will check labs, reviewed pre-e s/s, reasons to seek care  3) Suspected LGA, EFW 97% @ 37wks  Meds: No orders of the defined types were placed in this encounter.   Labs/procedures today: sve  Treatment Plan:  IOL @ 39-40wks  Reviewed: Term labor symptoms and general obstetric precautions including but not limited to vaginal bleeding, contractions, leaking of fluid and fetal movement were reviewed in detail with the patient.  All questions were answered. Has home bp cuff. Check bp QID at home  Follow-up: Return for friday for nst/hrob.  Orders Placed This Encounter  Procedures  . CBC  . Comprehensive metabolic panel  . Protein / creatinine ratio, urine  . POC Urinalysis Dipstick OB   Roma Schanz CNM, [facility] 09/07/2018 2:58 PM

## 2018-09-07 NOTE — Progress Notes (Signed)
[place] 02+3 wks,cephalic,BPP 3/4,DHWYSHUOH placenta gr 3,fhr 166 bpm,afi 13.5 cm,RI .50,.62,.52=51%

## 2018-09-07 NOTE — Patient Instructions (Signed)
Malachi Bonds, I greatly value your feedback.  If you receive a survey following your visit with [place] today, we appreciate you taking the time to fill it out.  Thanks, Knute Neu, CNM, [facility]  [place]!!! It is now [place] at [facility] ([place], [place] [address]) Entrance located off of [address] parking   Go to [email or web address] to register for FREE online childbirth classes    Call the office [phone]) or go to [facility] if:  You begin to have strong, frequent contractions  Your water breaks.  Sometimes it is a big gush of fluid, sometimes it is just a trickle that keeps getting your panties wet or running down your legs  You have vaginal bleeding.  It is normal to have a small amount of spotting if your cervix was checked.   You don't feel your baby moving like normal.  If you don't, get you something to eat and drink and lay down and focus on feeling your baby move.  You should feel at least 10 movements in 2 hours.  If you don't, you should call the office or go to [facility].   Call the office [phone]) or go to [facility] hospital for these signs of pre-eclampsia:  Severe headache that does not go away with Tylenol  Visual changes- seeing spots, double, blurred vision  Pain under your right breast or upper abdomen that does not go away with Tums or heartburn medicine  Nausea and/or vomiting  Severe swelling in your hands, feet, and face      Preeclampsia and Eclampsia Preeclampsia is a serious condition that may develop during pregnancy. This condition causes high blood pressure and increased protein in your urine along with other symptoms, such as headaches and vision changes. These symptoms may develop as the condition gets worse. Preeclampsia may occur at 20 weeks of pregnancy or later. Diagnosing and treating preeclampsia early is very important. If not treated early, it can cause  serious problems for you and your baby. One problem it can lead to is eclampsia. Eclampsia is a condition that causes muscle jerking or shaking (convulsions or seizures) and other serious problems for the mother. During pregnancy, delivering your baby may be the best treatment for preeclampsia or eclampsia. For most women, preeclampsia and eclampsia symptoms go away after giving birth. In rare cases, a woman may develop preeclampsia after giving birth (postpartum preeclampsia). This usually occurs within 48 hours after childbirth but may occur up to 6 weeks after giving birth. What are the causes? The cause of preeclampsia is not known. What increases the risk? The following risk factors make you more likely to develop preeclampsia:  Being pregnant for the first time.  Having had preeclampsia during a past pregnancy.  Having a family history of preeclampsia.  Having high blood pressure.  Being pregnant with more than one baby.  Being 62 or older.  Being African-American.  Having kidney disease or diabetes.  Having medical conditions such as lupus or blood diseases.  Being very overweight (obese). What are the signs or symptoms? The most common symptoms are:  Severe headaches.  Vision problems, such as blurred or double vision.  Abdominal pain, especially upper abdominal pain. Other symptoms that may develop as the condition gets worse include:  Sudden weight gain.  Sudden swelling of the hands, face, legs, and feet.  Severe nausea and vomiting.  Numbness in the face, arms, legs, and feet.  Dizziness.  Urinating less than usual.  Slurred speech.  Convulsions or seizures. How is this diagnosed? There are no screening tests for preeclampsia. Your health care provider will ask you about symptoms and check for signs of preeclampsia during your prenatal visits. You may also have tests that include:  Checking your blood pressure.  Urine tests to check for protein.  Your health care provider will check for this at every prenatal visit.  Blood tests.  Monitoring your baby's heart rate.  Ultrasound. How is this treated? You and your health care provider will determine the treatment approach that is best for you. Treatment may include:  Having more frequent prenatal exams to check for signs of preeclampsia, if you have an increased risk for preeclampsia.  Medicine to lower your blood pressure.  Staying in the hospital, if your condition is severe. There, treatment will focus on controlling your blood pressure and the amount of fluids in your body (fluid retention).  Taking medicine (magnesium sulfate) to prevent seizures. This may be given as an injection or through an IV.  Taking a low-dose aspirin during your pregnancy.  Delivering your baby early. You may have your labor started with medicine (induced), or you may have a cesarean delivery. Follow these instructions at home: Eating and drinking   Drink enough fluid to keep your urine pale yellow.  Avoid caffeine. Lifestyle  Do not use any products that contain nicotine or tobacco, such as cigarettes and e-cigarettes. If you need help quitting, ask your health care provider.  Do not use alcohol or drugs.  Avoid stress as much as possible. Rest and get plenty of sleep. General instructions  Take over-the-counter and prescription medicines only as told by your health care provider.  When lying down, lie on your left side. This keeps pressure off your major blood vessels.  When sitting or lying down, raise (elevate) your feet. Try putting some pillows underneath your lower legs.  Exercise regularly. Ask your health care provider what kinds of exercise are best for you.  Keep all follow-up and prenatal visits as told by your health care provider. This is important. How is this prevented? There is no known way of preventing preeclampsia or eclampsia from developing. However, to lower your  risk of complications and detect problems early:  Get regular prenatal care. Your health care provider may be able to diagnose and treat the condition early.  Maintain a healthy weight. Ask your health care provider for help managing weight gain during pregnancy.  Work with your health care provider to manage any long-term (chronic) health conditions you have, such as diabetes or kidney problems.  You may have tests of your blood pressure and kidney function after giving birth.  Your health care provider may have you take low-dose aspirin during your next pregnancy. Contact a health care provider if:  You have symptoms that your health care provider told you may require more treatment or monitoring, such as: ? Headaches. ? Nausea or vomiting. ? Abdominal pain. ? Dizziness. ? Light-headedness. Get help right away if:  You have severe: ? Abdominal pain. ? Headaches that do not get better. ? Dizziness. ? Vision problems. ? Confusion. ? Nausea or vomiting.  You have any of the following: ? A seizure. ? Sudden, rapid weight gain. ? Sudden swelling in your hands, ankles, or face. ? Trouble moving any part of your body. ? Numbness in any part of your body. ? Trouble speaking. ? Abnormal bleeding.  You faint. Summary  Preeclampsia is a serious condition that may develop during pregnancy.  This condition causes high blood pressure and increased protein in your urine along with other symptoms, such as headaches and vision changes.  Diagnosing and treating preeclampsia early is very important. If not treated early, it can cause serious problems for you and your baby.  Get help right away if you have symptoms that your health care provider told you to watch for. This information is not intended to replace advice given to you by your health care provider. Make sure you discuss any questions you have with your health care provider. Document Released: 01/18/2000 Document Revised:  09/22/2017 Document Reviewed: 08/27/2015 Elsevier Patient Education  2020 [employer]

## 2018-09-08 LAB — COMPREHENSIVE METABOLIC PANEL
ALT: 9 IU/L (ref 0–32)
AST: 15 IU/L (ref 0–40)
Albumin/Globulin Ratio: 1.3 (ref 1.2–2.2)
Albumin: 3.3 g/dL — ABNORMAL LOW (ref 3.9–5.0)
Alkaline Phosphatase: 137 IU/L — ABNORMAL HIGH (ref 39–117)
BUN/Creatinine Ratio: 5 — ABNORMAL LOW (ref 9–23)
BUN: 3 mg/dL — ABNORMAL LOW (ref 6–20)
Bilirubin Total: <0.2 mg/dL (ref 0.0–1.2)
CO2: 19 mmol/L — ABNORMAL LOW (ref 20–29)
Calcium: 8.8 mg/dL (ref 8.7–10.2)
Chloride: 105 mmol/L (ref 96–106)
Creatinine, Ser: 0.62 mg/dL (ref 0.57–1.00)
GFR calc Af Amer: 145 mL/min/{1.73_m2} (ref >59)
GFR calc non Af Amer: 126 mL/min/{1.73_m2} (ref >59)
Globulin, Total: 2.5 g/dL (ref 1.5–4.5)
Glucose: 97 mg/dL (ref 65–99)
Potassium: 3.3 mmol/L — ABNORMAL LOW (ref 3.5–5.2)
Sodium: 141 mmol/L (ref 134–144)
Total Protein: 5.8 g/dL — ABNORMAL LOW (ref 6.0–8.5)

## 2018-09-08 LAB — CBC
Hematocrit: 35.7 % (ref 34.0–46.6)
Hemoglobin: 11.7 g/dL (ref 11.1–15.9)
MCH: 27.8 pg (ref 26.6–33.0)
MCHC: 32.8 g/dL (ref 31.5–35.7)
MCV: 85 fL (ref 79–97)
Platelets: 259 10*3/uL (ref 150–450)
RBC: 4.21 x10E6/uL (ref 3.77–5.28)
RDW: 13.5 % (ref 11.7–15.4)
WBC: 10 10*3/uL (ref 3.4–10.8)

## 2018-09-08 LAB — PROTEIN / CREATININE RATIO, URINE
Creatinine, Urine: 190.7 mg/dL
Protein, Ur: 43.4 mg/dL
Protein/Creat Ratio: 228 mg/g creat — ABNORMAL HIGH (ref 0–200)

## 2018-09-09 ENCOUNTER — Other Ambulatory Visit: Payer: Self-pay | Admitting: Women's Health

## 2018-09-09 MED ORDER — POTASSIUM CHLORIDE ER 20 MEQ PO TBCR: 20.0000 meq | EXTENDED_RELEASE_TABLET | Freq: Every day | ORAL | 0 refills | Status: DC

## 2018-09-10 ENCOUNTER — Other Ambulatory Visit: Payer: Self-pay

## 2018-09-10 ENCOUNTER — Ambulatory Visit (INDEPENDENT_AMBULATORY_CARE_PROVIDER_SITE_OTHER): Payer: Medicaid Other | Admitting: *Deleted

## 2018-09-10 VITALS — BP 141/86 | HR 101 | Wt 224.2 lb

## 2018-09-10 DIAGNOSIS — O0993 Supervision of high risk pregnancy, unspecified, third trimester: Secondary | ICD-10-CM | POA: Diagnosis not present

## 2018-09-10 DIAGNOSIS — Z331 Pregnant state, incidental: Secondary | ICD-10-CM

## 2018-09-10 DIAGNOSIS — Z1389 Encounter for screening for other disorder: Secondary | ICD-10-CM

## 2018-09-10 DIAGNOSIS — O10919 Unspecified pre-existing hypertension complicating pregnancy, unspecified trimester: Secondary | ICD-10-CM

## 2018-09-10 DIAGNOSIS — O10913 Unspecified pre-existing hypertension complicating pregnancy, third trimester: Secondary | ICD-10-CM

## 2018-09-10 DIAGNOSIS — O0990 Supervision of high risk pregnancy, unspecified, unspecified trimester: Secondary | ICD-10-CM

## 2018-09-10 LAB — POCT URINALYSIS DIPSTICK OB
Blood, UA: NEGATIVE
Glucose, UA: NEGATIVE
Ketones, UA: NEGATIVE
Leukocytes, UA: NEGATIVE
Nitrite, UA: NEGATIVE
POC,PROTEIN,UA: NEGATIVE

## 2018-09-10 NOTE — Progress Notes (Signed)
NST reviewed by Dr Elonda Husky. Pt to return as scheduled for next appt.

## 2018-09-13 ENCOUNTER — Other Ambulatory Visit: Payer: Self-pay

## 2018-09-13 ENCOUNTER — Ambulatory Visit (INDEPENDENT_AMBULATORY_CARE_PROVIDER_SITE_OTHER): Payer: Medicaid Other | Admitting: *Deleted

## 2018-09-13 ENCOUNTER — Other Ambulatory Visit: Payer: Self-pay | Admitting: Women's Health

## 2018-09-13 DIAGNOSIS — O10913 Unspecified pre-existing hypertension complicating pregnancy, third trimester: Secondary | ICD-10-CM

## 2018-09-13 DIAGNOSIS — O10919 Unspecified pre-existing hypertension complicating pregnancy, unspecified trimester: Secondary | ICD-10-CM

## 2018-09-13 DIAGNOSIS — O0990 Supervision of high risk pregnancy, unspecified, unspecified trimester: Secondary | ICD-10-CM

## 2018-09-13 NOTE — Progress Notes (Signed)
Patient states she has not felt the baby move as much over the last couple of days.  NST reviewed by Dr Elonda Husky . Pt to return tomorrow as scheduled for appt. Encouraged patient to lay on side to do kick counts as she normally lays on her back to do them.

## 2018-09-14 ENCOUNTER — Encounter: Payer: Self-pay | Admitting: Women's Health

## 2018-09-14 ENCOUNTER — Other Ambulatory Visit: Payer: Self-pay | Admitting: Advanced Practice Midwife

## 2018-09-14 ENCOUNTER — Other Ambulatory Visit: Payer: Self-pay

## 2018-09-14 ENCOUNTER — Ambulatory Visit (INDEPENDENT_AMBULATORY_CARE_PROVIDER_SITE_OTHER): Payer: Medicaid Other | Admitting: Women's Health

## 2018-09-14 ENCOUNTER — Ambulatory Visit (INDEPENDENT_AMBULATORY_CARE_PROVIDER_SITE_OTHER): Payer: Medicaid Other

## 2018-09-14 VITALS — BP 138/86 | HR 91 | Wt 226.0 lb

## 2018-09-14 DIAGNOSIS — Z3A39 39 weeks gestation of pregnancy: Secondary | ICD-10-CM

## 2018-09-14 DIAGNOSIS — O10919 Unspecified pre-existing hypertension complicating pregnancy, unspecified trimester: Secondary | ICD-10-CM

## 2018-09-14 DIAGNOSIS — O10913 Unspecified pre-existing hypertension complicating pregnancy, third trimester: Secondary | ICD-10-CM

## 2018-09-14 DIAGNOSIS — O0993 Supervision of high risk pregnancy, unspecified, third trimester: Secondary | ICD-10-CM

## 2018-09-14 DIAGNOSIS — Z1389 Encounter for screening for other disorder: Secondary | ICD-10-CM

## 2018-09-14 DIAGNOSIS — O0990 Supervision of high risk pregnancy, unspecified, unspecified trimester: Secondary | ICD-10-CM

## 2018-09-14 DIAGNOSIS — Z331 Pregnant state, incidental: Secondary | ICD-10-CM

## 2018-09-14 LAB — POCT URINALYSIS DIPSTICK OB
Blood, UA: NEGATIVE
Glucose, UA: NEGATIVE
Ketones, UA: NEGATIVE
Leukocytes, UA: NEGATIVE
Nitrite, UA: NEGATIVE
POC,PROTEIN,UA: NEGATIVE
Urobilinogen, UA: NEGATIVE E.U./dL — AB

## 2018-09-14 NOTE — Progress Notes (Signed)
   HIGH-RISK PREGNANCY VISIT Patient name: Sara Morse MRN [id number]  Date of birth: Dec 02, 1995 Chief Complaint:   High Risk Gestation (u/s)  History of Present Illness:   Sara Morse is a 23 y.o. G73P0010 female at [redacted]w[redacted]d with an Estimated Date of Delivery: 09/18/18 being seen today for ongoing management of a high-risk pregnancy complicated by [facility] currently on no meds, suspected LGA 97.5% at 37wks Today she reports no complaints. Contractions: Regular.  .  Movement: Present. denies leaking of fluid.  Review of Systems:   Pertinent items are noted in HPI Denies abnormal vaginal discharge w/ itching/odor/irritation, headaches, visual changes, shortness of breath, chest pain, abdominal pain, severe nausea/vomiting, or problems with urination or bowel movements unless otherwise stated above. Pertinent History Reviewed:  Reviewed past medical,surgical, social, obstetrical and family history.  Reviewed problem list, medications and allergies. Physical Assessment:   Vitals:   09/14/18 1120  BP: 138/86  Pulse: 91  Weight: 226 lb (102.5 kg)  Body mass index is 38.79 kg/m.           Physical Examination:   General appearance: alert, well appearing, and in no distress  Mental status: alert, oriented to person, place, and time  Skin: warm & dry   Extremities: Edema: Mild pitting, slight indentation    Cardiovascular: normal heart rate noted  Respiratory: normal respiratory effort, no distress  Abdomen: gravid, soft, non-tender  Pelvic: Cervical exam performed  Dilation: Closed Effacement (%): 50 Station: -3  Fetal Status: Fetal Heart Rate (bpm): 157 u/s   Movement: Present Presentation: Vertex  Fetal Surveillance Testing today: [place] 33+2 wks,cephalic,posterior placenta gr 3,afi 10.5 cm,fhr 157 bpm,RI .60,.57,.60,.55=66%,BPP 8/8   Results for orders placed or performed in visit on 09/14/18 (from the past 24 hour(s))  POC Urinalysis Dipstick OB   Collection Time: 09/14/18 11:26 AM   Result Value Ref Range   Color, UA     Clarity, UA     Glucose, UA Negative Negative   Bilirubin, UA     Ketones, UA neg    Spec Grav, UA     Blood, UA neg    pH, UA     POC,PROTEIN,UA Negative Negative, Trace, Small (1+), Moderate (2+), Large (3+), 4+   Urobilinogen, UA negative (A) 0.2 or 1.0 E.U./dL   Nitrite, UA neg    Leukocytes, UA Negative Negative   Appearance     Odor      Assessment & Plan:  1) High-risk pregnancy G2P0010 at [redacted]w[redacted]d with an Estimated Date of Delivery: 09/18/18   2) CHTN, stable, no meds  3) Suspected LGA, EFW 97.5% @ 37wks, extrapolates to 4550g at 40wks  Meds: No orders of the defined types were placed in this encounter.  Labs/procedures today: bpp/dopp, SVE  Treatment Plan:  IOL 8/15 @ [place],  IOL form faxed via Epic and orders placed   Reviewed: Term labor symptoms and general obstetric precautions including but not limited to vaginal bleeding, contractions, leaking of fluid and fetal movement were reviewed in detail with the patient.  All questions were answered.   Follow-up: Return for As scheduled Friday for nst.  Orders Placed This Encounter  Procedures  . POC Urinalysis Dipstick OB   Roma Schanz CNM, [facility] 09/14/2018 11:59 AM

## 2018-09-14 NOTE — Patient Instructions (Signed)
Malachi Bonds, I greatly value your feedback.  If you receive a survey following your visit with [place] today, we appreciate you taking the time to fill it out.  Thanks, Knute Neu, CNM, [facility]  Taj[place]!!! It is now [place] [facility] ([place], [place] [address]) Entrance located off of [place] parking   Go to [email or web address] to register for FREE online childbirth classes    Call the office [phone]) or go to [facility] if:  You begin to have strong, frequent contractions  Your water breaks.  Sometimes it is a big gush of fluid, sometimes it is just a trickle that keeps getting your panties wet or running down your legs  You have vaginal bleeding.  It is normal to have a small amount of spotting if your cervix was checked.   You don't feel your baby moving like normal.  If you don't, get you something to eat and drink and lay down and focus on feeling your baby move.  You should feel at least 10 movements in 2 hours.  If you don't, you should call the office or go to [facility] Blood Pressure Monitoring for Patients   Your provider has recommended that you check your blood pressure (BP) at least once a week at home. If you do not have a blood pressure cuff at home, one will be provided for you. Contact your provider if you have not received your monitor within 1 week.   Helpful Tips for Accurate Home Blood Pressure Checks   Don't smoke, exercise, or drink caffeine 30 minutes before checking your BP  Use the restroom before checking your BP (a full bladder can raise your pressure)  Relax in a comfortable upright chair  Feet on the ground  Left arm resting comfortably on a flat surface at the level of your heart  Legs uncrossed  Back supported  Sit quietly and don't talk  Place the cuff on your bare arm  Adjust snuggly, so that only two fingertips can fit between your skin and  the top of the cuff  Check 2 readings separated by at least one minute  Keep a log of your BP readings  For a visual, please reference this diagram: http://ccnc.care/bpdiagram  Provider Name: [facility]     Phone: [phone]  Zone 1: ALL CLEAR  Continue to monitor your symptoms:   BP reading is less than 140 (top number) or less than 90 (bottom number)   No right upper stomach pain  No headaches or seeing spots  No feeling nauseated or throwing up  No swelling in face and hands  Zone 2: CAUTION Call your doctor's office for any of the following:   BP reading is greater than 140 (top number) or greater than 90 (bottom number)   Stomach pain under your ribs in the middle or right side  Headaches or seeing spots  Feeling nauseated or throwing up  Swelling in face and hands  Zone 3: EMERGENCY  Seek immediate medical care if you have any of the following:   BP reading is greater than160 (top number) or greater than 110 (bottom number)  Severe headaches not improving with Tylenol  Serious difficulty catching your breath  Any worsening symptoms from Zone 2    Braxton Hicks Contractions Contractions of the uterus can occur throughout pregnancy, but they are not always a sign that you are in  labor. You may have practice contractions called Braxton Hicks contractions. These false labor contractions are sometimes confused with true labor. What are [place] contractions? Braxton Hicks contractions are tightening movements that occur in the muscles of the uterus before labor. Unlike true labor contractions, these contractions do not result in opening (dilation) and thinning of the cervix. Toward the end of pregnancy (32-34 weeks), Braxton Hicks contractions can happen more often and may become stronger. These contractions are sometimes difficult to tell apart from true labor because they can be very uncomfortable. You should not feel embarrassed if you go to the  hospital with false labor. Sometimes, the only way to tell if you are in true labor is for your health care provider to look for changes in the cervix. The health care provider will do a physical exam and may monitor your contractions. If you are not in true labor, the exam should show that your cervix is not dilating and your water has not broken. If there are no other health problems associated with your pregnancy, it is completely safe for you to be sent home with false labor. You may continue to have Braxton Hicks contractions until you go into true labor. How to tell the difference between true labor and false labor True labor  Contractions last 30-70 seconds.  Contractions become very regular.  Discomfort is usually felt in the top of the uterus, and it spreads to the lower abdomen and low back.  Contractions do not go away with walking.  Contractions usually become more intense and increase in frequency.  The cervix dilates and gets thinner. False labor  Contractions are usually shorter and not as strong as true labor contractions.  Contractions are usually irregular.  Contractions are often felt in the front of the lower abdomen and in the groin.  Contractions may go away when you walk around or change positions while lying down.  Contractions get weaker and are shorter-lasting as time goes on.  The cervix usually does not dilate or become thin. Follow these instructions at home:   Take over-the-counter and prescription medicines only as told by your health care provider.  Keep up with your usual exercises and follow other instructions from your health care provider.  Eat and drink lightly if you think you are going into labor.  If Braxton Hicks contractions are making you uncomfortable: ? Change your position from lying down or resting to walking, or change from walking to resting. ? Sit and rest in a tub of warm water. ? Drink enough fluid to keep your urine pale  yellow. Dehydration may cause these contractions. ? Do slow and deep breathing several times an hour.  Keep all follow-up prenatal visits as told by your health care provider. This is important. Contact a health care provider if:  You have a fever.  You have continuous pain in your abdomen. Get help right away if:  Your contractions become stronger, more regular, and closer together.  You have fluid leaking or gushing from your vagina.  You pass blood-tinged mucus (bloody show).  You have bleeding from your vagina.  You have low back pain that you never had before.  You feel your babys head pushing down and causing pelvic pressure.  Your baby is not moving inside you as much as it used to. Summary  Contractions that occur before labor are called Braxton Hicks contractions, false labor, or practice contractions.  Braxton Hicks contractions are usually shorter, weaker, farther apart, and less  regular than true labor contractions. True labor contractions usually become progressively stronger and regular, and they become more frequent.  Manage discomfort from [facility] contractions by changing position, resting in a warm bath, drinking plenty of water, or practicing deep breathing. This information is not intended to replace advice given to you by your health care provider. Make sure you discuss any questions you have with your health care provider. Document Released: 06/05/2016 Document Revised: 01/02/2017 Document Reviewed: 06/05/2016 Elsevier Patient Education  2020 [employer].

## 2018-09-14 NOTE — Progress Notes (Signed)
[place] 39+0 wks,cephalic,posterior placenta gr 3,afi 10.5 cm,fhr 157 bpm,RI .60,.57,.60,.55=66%,BPP 8/8

## 2018-09-14 NOTE — Treatment Plan (Signed)
Induction Assessment Scheduling Form Fax to [facility]:  [phone]  Sara Morse                                                                                   DOB:  [birth year]                                                            MRN:  [id number]                                                                     Phone #:  [phone] (Home Phone)                     Provider:  [facility]  GP:  [id number]                                                            Estimated Date of Delivery: 09/18/18  Dating Criteria: 7wk u/s    Medical Indications for induction:  CHTN no meds Admission Date/Time:  8/15 @ [facility] Gestational age on admission:  31.0   Filed Weights   09/14/18 1120  Weight: 226 lb (102.5 kg)   HIV:  Non Reactive (05/19 0912) GBS: Negative (07/21 1430)  Cl/50/-3, vtx   Method of induction(proposed):  cytotec   Scheduling Provider Signature:  Roma Schanz, CNM                                            Today's Date:  09/14/2018

## 2018-09-15 ENCOUNTER — Encounter (HOSPITAL_COMMUNITY): Payer: Self-pay | Admitting: *Deleted

## 2018-09-15 ENCOUNTER — Telehealth (HOSPITAL_COMMUNITY): Payer: Self-pay | Admitting: *Deleted

## 2018-09-15 NOTE — Telephone Encounter (Signed)
Preadmission screen  

## 2018-09-16 ENCOUNTER — Ambulatory Visit (HOSPITAL_COMMUNITY)
Admission: RE | Admit: 2018-09-16 | Discharge: 2018-09-16 | Disposition: A | Discharge status: Home / Self Care | Payer: Medicaid Other | Source: Ambulatory Visit | Attending: Obstetrics & Gynecology | Admitting: Obstetrics & Gynecology

## 2018-09-16 ENCOUNTER — Other Ambulatory Visit: Payer: Self-pay

## 2018-09-16 DIAGNOSIS — Z20828 Contact with and (suspected) exposure to other viral communicable diseases: Secondary | ICD-10-CM | POA: Diagnosis present

## 2018-09-16 LAB — SARS CORONAVIRUS 2 (TAT 6-24 HRS): SARS Coronavirus 2: NEGATIVE

## 2018-09-16 NOTE — MAU Note (Signed)
Asymptomatic, swab collected. 

## 2018-09-17 ENCOUNTER — Ambulatory Visit (INDEPENDENT_AMBULATORY_CARE_PROVIDER_SITE_OTHER): Payer: Medicaid Other | Admitting: *Deleted

## 2018-09-17 ENCOUNTER — Other Ambulatory Visit: Payer: Self-pay | Admitting: Advanced Practice Midwife

## 2018-09-17 VITALS — BP 142/83 | HR 101 | Wt 225.0 lb

## 2018-09-17 DIAGNOSIS — O10913 Unspecified pre-existing hypertension complicating pregnancy, third trimester: Secondary | ICD-10-CM | POA: Diagnosis not present

## 2018-09-17 DIAGNOSIS — O0993 Supervision of high risk pregnancy, unspecified, third trimester: Secondary | ICD-10-CM | POA: Diagnosis not present

## 2018-09-17 DIAGNOSIS — O10919 Unspecified pre-existing hypertension complicating pregnancy, unspecified trimester: Secondary | ICD-10-CM

## 2018-09-17 DIAGNOSIS — Z3A40 40 weeks gestation of pregnancy: Secondary | ICD-10-CM | POA: Diagnosis not present

## 2018-09-17 DIAGNOSIS — O0990 Supervision of high risk pregnancy, unspecified, unspecified trimester: Secondary | ICD-10-CM

## 2018-09-17 DIAGNOSIS — Z1389 Encounter for screening for other disorder: Secondary | ICD-10-CM

## 2018-09-17 DIAGNOSIS — Z331 Pregnant state, incidental: Secondary | ICD-10-CM

## 2018-09-17 LAB — POCT URINALYSIS DIPSTICK OB
Blood, UA: NEGATIVE
Glucose, UA: NEGATIVE
Leukocytes, UA: NEGATIVE
Nitrite, UA: NEGATIVE
POC,PROTEIN,UA: NEGATIVE

## 2018-09-17 NOTE — Progress Notes (Signed)
NST reviewed by Dr Elonda Husky.  Pt to go as scheduled for induction tonight.

## 2018-09-18 ENCOUNTER — Other Ambulatory Visit: Payer: Self-pay

## 2018-09-18 ENCOUNTER — Encounter (HOSPITAL_COMMUNITY): Payer: Self-pay

## 2018-09-18 ENCOUNTER — Inpatient Hospital Stay (HOSPITAL_COMMUNITY): Payer: Medicaid Other

## 2018-09-18 ENCOUNTER — Inpatient Hospital Stay (HOSPITAL_COMMUNITY)
Admission: AD | Admit: 2018-09-18 | Discharge: 2018-09-22 | DRG: 788 | Disposition: A | Discharge status: Home / Self Care | Payer: Medicaid Other | Attending: Obstetrics and Gynecology | Admitting: Obstetrics and Gynecology

## 2018-09-18 DIAGNOSIS — O3663X0 Maternal care for excessive fetal growth, third trimester, not applicable or unspecified: Secondary | ICD-10-CM | POA: Diagnosis present

## 2018-09-18 DIAGNOSIS — O1002 Pre-existing essential hypertension complicating childbirth: Secondary | ICD-10-CM | POA: Diagnosis not present

## 2018-09-18 DIAGNOSIS — F419 Anxiety disorder, unspecified: Secondary | ICD-10-CM | POA: Diagnosis present

## 2018-09-18 DIAGNOSIS — O0990 Supervision of high risk pregnancy, unspecified, unspecified trimester: Secondary | ICD-10-CM

## 2018-09-18 DIAGNOSIS — F329 Major depressive disorder, single episode, unspecified: Secondary | ICD-10-CM | POA: Diagnosis present

## 2018-09-18 DIAGNOSIS — E669 Obesity, unspecified: Secondary | ICD-10-CM | POA: Diagnosis present

## 2018-09-18 DIAGNOSIS — O99344 Other mental disorders complicating childbirth: Secondary | ICD-10-CM | POA: Diagnosis present

## 2018-09-18 DIAGNOSIS — I10 Essential (primary) hypertension: Secondary | ICD-10-CM | POA: Diagnosis present

## 2018-09-18 DIAGNOSIS — Z3A40 40 weeks gestation of pregnancy: Secondary | ICD-10-CM | POA: Diagnosis not present

## 2018-09-18 DIAGNOSIS — O3660X0 Maternal care for excessive fetal growth, unspecified trimester, not applicable or unspecified: Secondary | ICD-10-CM

## 2018-09-18 DIAGNOSIS — Z98891 History of uterine scar from previous surgery: Secondary | ICD-10-CM

## 2018-09-18 DIAGNOSIS — O480 Post-term pregnancy: Secondary | ICD-10-CM | POA: Diagnosis not present

## 2018-09-18 DIAGNOSIS — O99214 Obesity complicating childbirth: Secondary | ICD-10-CM | POA: Diagnosis present

## 2018-09-18 DIAGNOSIS — O10919 Unspecified pre-existing hypertension complicating pregnancy, unspecified trimester: Secondary | ICD-10-CM

## 2018-09-18 LAB — COMPREHENSIVE METABOLIC PANEL
ALT: 14 U/L (ref 0–44)
AST: 22 U/L (ref 15–41)
Albumin: 2.8 g/dL — ABNORMAL LOW (ref 3.5–5.0)
Alkaline Phosphatase: 153 U/L — ABNORMAL HIGH (ref 38–126)
Anion gap: 12 (ref 5–15)
BUN: <5 mg/dL — ABNORMAL LOW (ref 6–20)
CO2: 18 mmol/L — ABNORMAL LOW (ref 22–32)
Calcium: 9.2 mg/dL (ref 8.9–10.3)
Chloride: 109 mmol/L (ref 98–111)
Creatinine, Ser: 0.59 mg/dL (ref 0.44–1.00)
GFR calc Af Amer: >60 mL/min (ref >60)
GFR calc non Af Amer: >60 mL/min (ref >60)
Glucose, Bld: 87 mg/dL (ref 70–99)
Potassium: 3.6 mmol/L (ref 3.5–5.1)
Sodium: 139 mmol/L (ref 135–145)
Total Bilirubin: 0.6 mg/dL (ref 0.3–1.2)
Total Protein: 5.5 g/dL — ABNORMAL LOW (ref 6.5–8.1)

## 2018-09-18 LAB — CBC
HCT: 36.8 % (ref 36.0–46.0)
Hemoglobin: 12.1 g/dL (ref 12.0–15.0)
MCH: 27.8 pg (ref 26.0–34.0)
MCHC: 32.9 g/dL (ref 30.0–36.0)
MCV: 84.6 fL (ref 80.0–100.0)
Platelets: 300 10*3/uL (ref 150–400)
RBC: 4.35 MIL/uL (ref 3.87–5.11)
RDW: 13.2 % (ref 11.5–15.5)
WBC: 10.9 10*3/uL — ABNORMAL HIGH (ref 4.0–10.5)
nRBC: 0 % (ref 0.0–0.2)

## 2018-09-18 LAB — PROTEIN / CREATININE RATIO, URINE
Creatinine, Urine: 122 mg/dL
Protein Creatinine Ratio: 0.21 mg/mg{Cre} — ABNORMAL HIGH (ref 0.00–0.15)
Total Protein, Urine: 26 mg/dL

## 2018-09-18 LAB — TYPE AND SCREEN
ABO/RH(D): O POS
Antibody Screen: NEGATIVE

## 2018-09-18 LAB — RPR: RPR Ser Ql: NONREACTIVE

## 2018-09-18 LAB — ABO/RH: ABO/RH(D): O POS

## 2018-09-18 MED ORDER — OXYCODONE-ACETAMINOPHEN 5-325 MG PO TABS: 1.0000 | ORAL_TABLET | ORAL | Status: DC | PRN

## 2018-09-18 MED ORDER — MISOPROSTOL 25 MCG QUARTER TABLET
25.0000 ug | ORAL_TABLET | ORAL | Status: DC | PRN
  Administered 2018-09-18 (×3): 25 ug via VAGINAL
  Filled 2018-09-18 (×3): qty 1

## 2018-09-18 MED ORDER — LACTATED RINGERS IV SOLN
INTRAVENOUS | Status: DC
  Administered 2018-09-18: 17:00:00 125 mL/h via INTRAVENOUS
  Administered 2018-09-18: 01:00:00 via INTRAVENOUS
  Administered 2018-09-18: 08:00:00 125 mL/h via INTRAVENOUS
  Administered 2018-09-19 – 2018-09-20 (×4): via INTRAVENOUS

## 2018-09-18 MED ORDER — SOD CITRATE-CITRIC ACID 500-334 MG/5ML PO SOLN
30.0000 mL | ORAL | Status: DC | PRN
  Administered 2018-09-20: 30 mL via ORAL
  Filled 2018-09-18: qty 30

## 2018-09-18 MED ORDER — OXYCODONE-ACETAMINOPHEN 5-325 MG PO TABS: 2.0000 | ORAL_TABLET | ORAL | Status: DC | PRN

## 2018-09-18 MED ORDER — TERBUTALINE SULFATE 1 MG/ML IJ SOLN: 0.2500 mg | Freq: Once | INTRAMUSCULAR | Status: DC | PRN

## 2018-09-18 MED ORDER — FLEET ENEMA 7-19 GM/118ML RE ENEM: 1.0000 | ENEMA | RECTAL | Status: DC | PRN

## 2018-09-18 MED ORDER — ONDANSETRON HCL 4 MG/2ML IJ SOLN
4.0000 mg | Freq: Four times a day (QID) | INTRAMUSCULAR | Status: DC | PRN
  Administered 2018-09-18: 09:00:00 4 mg via INTRAVENOUS
  Filled 2018-09-18: qty 2

## 2018-09-18 MED ORDER — MISOPROSTOL 50MCG HALF TABLET
50.0000 ug | ORAL_TABLET | ORAL | Status: DC | PRN
  Administered 2018-09-18: 15:00:00 50 ug via BUCCAL
  Filled 2018-09-18: qty 1

## 2018-09-18 MED ORDER — OXYTOCIN 40 UNITS IN NORMAL SALINE INFUSION - SIMPLE MED
2.5000 [IU]/h | INTRAVENOUS | Status: DC
  Filled 2018-09-18: qty 1000

## 2018-09-18 MED ORDER — HYDROXYZINE HCL 50 MG PO TABS: 50.0000 mg | ORAL_TABLET | Freq: Four times a day (QID) | ORAL | Status: DC | PRN

## 2018-09-18 MED ORDER — SERTRALINE HCL 50 MG PO TABS
50.0000 mg | ORAL_TABLET | Freq: Every day | ORAL | Status: DC
  Filled 2018-09-18 (×5): qty 1

## 2018-09-18 MED ORDER — LACTATED RINGERS IV SOLN: 500.0000 mL | INTRAVENOUS | Status: DC | PRN

## 2018-09-18 MED ORDER — ACETAMINOPHEN 325 MG PO TABS: 650.0000 mg | ORAL_TABLET | ORAL | Status: DC | PRN

## 2018-09-18 MED ORDER — ZOLPIDEM TARTRATE 5 MG PO TABS: 5.0000 mg | ORAL_TABLET | Freq: Every evening | ORAL | Status: DC | PRN

## 2018-09-18 MED ORDER — LIDOCAINE HCL (PF) 1 % IJ SOLN: 30.0000 mL | INTRAMUSCULAR | Status: DC | PRN

## 2018-09-18 MED ORDER — OXYTOCIN BOLUS FROM INFUSION: 500.0000 mL | Freq: Once | INTRAVENOUS | Status: DC

## 2018-09-18 MED ORDER — FENTANYL CITRATE (PF) 100 MCG/2ML IJ SOLN
50.0000 ug | INTRAMUSCULAR | Status: DC | PRN
  Administered 2018-09-18: 22:00:00 100 ug via INTRAVENOUS
  Administered 2018-09-18: 21:00:00 50 ug via INTRAVENOUS
  Administered 2018-09-18 – 2018-09-19 (×2): 100 ug via INTRAVENOUS
  Filled 2018-09-18 (×5): qty 2

## 2018-09-18 NOTE — Progress Notes (Signed)
Patient ID: Sara Morse, female   DOB: 09/07/1995, 23 y.o.   MRN: [id number] Doing well, not much pain  Vitals:   09/18/18 1510 09/18/18 1610 09/18/18 1805 09/18/18 1929  BP: 131/76 (!) 122/58 129/60 136/86  Pulse: 99 82 95 (!) 108  Resp: 16 16 16    Temp:   98.4 F (36.9 C) 98.2 F (36.8 C)  TempSrc:   Oral Oral  Weight:      Height:       FHR reactive UCs irregular  Dilation: Fingertip Effacement (%): 80 Cervical Position: Posterior Station: Ballotable Presentation: Vertex Exam by:: Hansel Feinstein, CNM  Foley inserted and balloon inflated Will continue to observe

## 2018-09-18 NOTE — Progress Notes (Signed)
Labor Progress Note Kaila Devries is a 23 y.o. G2P0010 at [redacted]w[redacted]d presented for IOL for CHTN  S:  Patient nauseous but overall doing well  O:  BP 130/61 (BP Location: Right Arm)   Pulse 83   Temp 98.3 F (36.8 C) (Oral)   Resp 16   Ht 5\' 4"  (1.626 m)   Wt 102.6 kg   LMP 11/25/2017   BMI 38.84 kg/m   Fetal Tracing:  Baseline: 130 Variability: moderate Accels: 15x15 Decels: none  Toco: 2-7  CVE: Dilation: Closed Effacement (%): Thick Cervical Position: Posterior Station: Ballotable Presentation: Vertex Exam by:: Ileana Ladd RN   A&P: 23 y.o. G2P0010 [redacted]w[redacted]d IOL CHTN #Labor: Continue cytotec. PRN meds for nausea #Pain: n/a #FWB: Cat 1 #GBS negative   Wende Mott, CNM

## 2018-09-18 NOTE — Progress Notes (Signed)
LABOR PROGRESS NOTE  Sara Morse is a 23 y.o. G2P0010 at [redacted]w[redacted]d  admitted for IOL for cHTN  Subjective: Patient is sitting in bed at this time. She is overall doing well.   Objective: BP 131/76 (BP Location: Right Arm)   Pulse 99   Temp 98.2 F (36.8 C) (Oral)   Resp 16   Ht 5\' 4"  (1.626 m)   Wt 102.6 kg   LMP 11/25/2017   BMI 38.84 kg/m  or  Vitals:   09/18/18 1309 09/18/18 1406 09/18/18 1408 09/18/18 1510  BP: 130/77  135/60 131/76  Pulse: (!) 105  90 99  Resp: 16 16  16   Temp:  98.2 F (36.8 C)    TempSrc:  Oral    Weight:      Height:         Dilation: Fingertip Effacement (%): Thick Cervical Position: Posterior Station: Ballotable Presentation: Vertex Exam by:: CNM Neill FHT: baseline rate 130, moderate varibility, 15x15acel, no decel Toco: irregular  Labs: Lab Results  Component Value Date   WBC 10.9 (H) 09/18/2018   HGB 12.1 09/18/2018   HCT 36.8 09/18/2018   MCV 84.6 09/18/2018   PLT 300 09/18/2018    Patient Active Problem List   Diagnosis Date Noted  . Chronic hypertension 09/18/2018  . LGA (large for gestational age) fetus affecting management of mother 09/18/2018  . Supervision of high risk pregnancy, antepartum 02/16/2018  . Chronic hypertension affecting pregnancy 02/16/2018  . Anxiety and depression 05/27/2017    Assessment / Plan: 23 y.o. G2P0010 at [redacted]w[redacted]d here for IOL for cHTN.  Labor: Switching Cytotec from vaginal to buccal to attempt more effect.  Fetal Wellbeing:  Cat I, reassuring tracing with no indication for intervention at this time. 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  Vanessa Kick [facility] Medical Student 09/18/2018, 3:12 PM

## 2018-09-18 NOTE — Progress Notes (Signed)
LABOR PROGRESS NOTE  Sara Morse is a 23 y.o. G2P0010 at [redacted]w[redacted]d  admitted for IOL for cHTN.  Subjective: Patient is lying in bed on her left side. She is overall doing well. No complaints currently.  Objective: BP 129/60 (BP Location: Right Arm)   Pulse 95   Temp 98.4 F (36.9 C) (Oral)   Resp 16   Ht 5\' 4"  (1.626 m)   Wt 102.6 kg   LMP 11/25/2017   BMI 38.84 kg/m  or  Vitals:   09/18/18 1408 09/18/18 1510 09/18/18 1610 09/18/18 1805  BP: 135/60 131/76 (!) 122/58 129/60  Pulse: 90 99 82 95  Resp:  16 16 16   Temp:    98.4 F (36.9 C)  TempSrc:    Oral  Weight:      Height:        Dilation: Fingertip Effacement (%): Thick Cervical Position: Posterior Station: Ballotable Presentation: Vertex Exam by:: CNM Neill FHT: baseline rate 130, moderate varibility, 15x15 acel, no decel Toco: 2-3 min  Labs: Lab Results  Component Value Date   WBC 10.9 (H) 09/18/2018   HGB 12.1 09/18/2018   HCT 36.8 09/18/2018   MCV 84.6 09/18/2018   PLT 300 09/18/2018    Patient Active Problem List   Diagnosis Date Noted  . Chronic hypertension 09/18/2018  . LGA (large for gestational age) fetus affecting management of mother 09/18/2018  . Supervision of high risk pregnancy, antepartum 02/16/2018  . Chronic hypertension affecting pregnancy 02/16/2018  . Anxiety and depression 05/27/2017    Assessment / Plan: 23 y.o. G2P0010 at [redacted]w[redacted]d here for IOL for cHTN.  Labor: Cytotec last given at 1515 (buccal). Continue q4h. Cervical checks as indicated. Fetal Wellbeing:  Cat I tracing, reassuring with no indication for intervention at this time Pain Control:  Maternally supported Anticipated MOD:  Vaginal cHTN: Continue to monitor  [place] Student 09/18/2018, 7:10 PM

## 2018-09-18 NOTE — H&P (Signed)
Sara Morse is a 23 y.o. female G2P0010 @ 40.0wks by 7wk scan presenting for IOL due to [facility]. Denies leaking or bldg; no H/A, N/V or visual disturbances. Her preg has been followed by the [facility] service since 7wks and has been remarkable for:  # cHTN (no meds in preg) # anxiety and depression (Zoloft 50mg ) # LGA (EFW 3928gm at 37wks)   OB History    Gravida  2   Para  0   Term      Preterm      AB  1   Living        SAB      TAB      Ectopic      Multiple      Live Births             Past Medical History:  Diagnosis Date  . Anxiety   . Hypertension   . Mental disorder    Past Surgical History:  Procedure Laterality Date  . DENTAL SURGERY     Family History: family history includes Breast cancer in her paternal grandmother; Cancer in her maternal grandfather and paternal grandfather; Other in her maternal grandmother. Social History:  reports that she has never smoked. She has never used smokeless tobacco. She reports that she does not drink alcohol or use drugs.     Maternal Diabetes: No Genetic Screening: Declined Maternal Ultrasounds/Referrals: Normal Fetal Ultrasounds or other Referrals:  None Maternal Substance Abuse:  No Significant Maternal Medications:  Meds include: Zoloft Significant Maternal Lab Results:  Group B Strep negative Other Comments:  None  ROS History  BPs: 135/72, 136/79 Blood pressure (!) 152/92, pulse (!) 117, temperature 98.1 F (36.7 C), temperature source Oral, resp. rate 16, last menstrual period 11/25/2017, unknown if currently breastfeeding. Exam Physical Exam  Constitutional: She is oriented to person, place, and time. She appears well-developed.  HENT:  Head: Normocephalic.  Neck: Normal range of motion.  Cardiovascular:  Sl tachycardic  Respiratory: Effort normal.  GI:  EFM 150-160, +accels, +variables, marked variability at times Ctx irreg/mild  Musculoskeletal: Normal range of motion.   Neurological: She is alert and oriented to person, place, and time.  Skin: Skin is warm and dry.  Psychiatric: She has a normal mood and affect. Her behavior is normal. Thought content normal.    Prenatal labs: ABO, Rh: O/Positive/-- (01/14 1602) Antibody: Negative (05/19 0912) Rubella: 1.55 (01/14 1602) RPR: Non Reactive (05/19 0912)  HBsAg: Negative (01/14 1602)  HIV: Non Reactive (05/19 0912)  GBS: Negative (07/21 1430)   CBC    Component Value Date/Time   WBC 10.9 (H) 09/18/2018 0037   RBC 4.35 09/18/2018 0037   HGB 12.1 09/18/2018 0037   HGB 11.7 09/07/2018 1515   HCT 36.8 09/18/2018 0037   HCT 35.7 09/07/2018 1515   PLT 300 09/18/2018 0037   PLT 259 09/07/2018 1515   MCV 84.6 09/18/2018 0037   MCV 85 09/07/2018 1515   MCH 27.8 09/18/2018 0037   MCHC 32.9 09/18/2018 0037   RDW 13.2 09/18/2018 0037   RDW 13.5 09/07/2018 1515   LYMPHSABS 2.3 02/16/2018 1602   EOSABS 0.1 02/16/2018 1602   BASOSABS 0.1 02/16/2018 1602   CMP     Component Value Date/Time   NA 139 09/18/2018 0037   NA 141 09/07/2018 1515   K 3.6 09/18/2018 0037   CL 109 09/18/2018 0037   CO2 18 (L) 09/18/2018 0037   GLUCOSE 87 09/18/2018 0037  BUN <5 (L) 09/18/2018 0037   BUN 3 (L) 09/07/2018 1515   CREATININE 0.59 09/18/2018 0037   CALCIUM 9.2 09/18/2018 0037   PROT 5.5 (L) 09/18/2018 0037   PROT 5.8 (L) 09/07/2018 1515   ALBUMIN 2.8 (L) 09/18/2018 0037   ALBUMIN 3.3 (L) 09/07/2018 1515   AST 22 09/18/2018 0037   ALT 14 09/18/2018 0037   ALKPHOS 153 (H) 09/18/2018 0037   BILITOT 0.6 09/18/2018 0037   BILITOT <0.2 09/07/2018 1515   GFRNONAA >60 09/18/2018 0037   GFRAA >60 09/18/2018 0037   Urine P/C: 0.21  Assessment/Plan: IUP@40 .0wks cHTN- no meds/neg pre-e labs GBS neg Unfavorable cervix  Admit to Labor and Delivery Plan on cx ripening with cytotec/cervical foley as able, then Pit/AROM prn Continue Zoloft 50mg  qd Anticipate SVD   Sara Morse CNM 09/18/2018, 12:35  AM

## 2018-09-19 ENCOUNTER — Encounter (HOSPITAL_COMMUNITY): Payer: Self-pay | Admitting: Anesthesiology

## 2018-09-19 ENCOUNTER — Inpatient Hospital Stay (HOSPITAL_COMMUNITY): Payer: Medicaid Other | Admitting: Anesthesiology

## 2018-09-19 LAB — CBC
HCT: 34.9 % — ABNORMAL LOW (ref 36.0–46.0)
HCT: 37.8 % (ref 36.0–46.0)
Hemoglobin: 11.5 g/dL — ABNORMAL LOW (ref 12.0–15.0)
Hemoglobin: 12.3 g/dL (ref 12.0–15.0)
MCH: 28.2 pg (ref 26.0–34.0)
MCH: 27.9 pg (ref 26.0–34.0)
MCHC: 33 g/dL (ref 30.0–36.0)
MCHC: 32.5 g/dL (ref 30.0–36.0)
MCV: 85.5 fL (ref 80.0–100.0)
MCV: 85.7 fL (ref 80.0–100.0)
Platelets: 253 10*3/uL (ref 150–400)
Platelets: 256 10*3/uL (ref 150–400)
RBC: 4.08 MIL/uL (ref 3.87–5.11)
RBC: 4.41 MIL/uL (ref 3.87–5.11)
RDW: 13.2 % (ref 11.5–15.5)
RDW: 13.3 % (ref 11.5–15.5)
WBC: 12.3 10*3/uL — ABNORMAL HIGH (ref 4.0–10.5)
WBC: 12.2 10*3/uL — ABNORMAL HIGH (ref 4.0–10.5)
nRBC: 0 % (ref 0.0–0.2)
nRBC: 0 % (ref 0.0–0.2)

## 2018-09-19 MED ORDER — DIPHENHYDRAMINE HCL 50 MG/ML IJ SOLN: 12.5000 mg | INTRAMUSCULAR | Status: DC | PRN

## 2018-09-19 MED ORDER — SODIUM CHLORIDE (PF) 0.9 % IJ SOLN
INTRAMUSCULAR | Status: DC | PRN
  Administered 2018-09-19: 12 mL/h via EPIDURAL

## 2018-09-19 MED ORDER — PHENYLEPHRINE 40 MCG/ML (10ML) SYRINGE FOR IV PUSH (FOR BLOOD PRESSURE SUPPORT): 80.0000 ug | PREFILLED_SYRINGE | INTRAVENOUS | Status: DC | PRN

## 2018-09-19 MED ORDER — LIDOCAINE HCL (PF) 1 % IJ SOLN
INTRAMUSCULAR | Status: DC | PRN
  Administered 2018-09-19 (×2): 4 mL via EPIDURAL

## 2018-09-19 MED ORDER — LACTATED RINGERS IV SOLN
500.0000 mL | Freq: Once | INTRAVENOUS | Status: AC
  Administered 2018-09-19: 17:00:00 500 mL via INTRAVENOUS

## 2018-09-19 MED ORDER — OXYTOCIN 40 UNITS IN NORMAL SALINE INFUSION - SIMPLE MED
1.0000 m[IU]/min | INTRAVENOUS | Status: DC
  Administered 2018-09-19: 2 m[IU]/min via INTRAVENOUS

## 2018-09-19 MED ORDER — EPHEDRINE 5 MG/ML INJ: 10.0000 mg | INTRAVENOUS | Status: DC | PRN

## 2018-09-19 MED ORDER — FENTANYL-BUPIVACAINE-NACL 0.5-0.125-0.9 MG/250ML-% EP SOLN
12.0000 mL/h | EPIDURAL | Status: DC | PRN
  Filled 2018-09-19: qty 250

## 2018-09-19 MED ORDER — TERBUTALINE SULFATE 1 MG/ML IJ SOLN: 0.2500 mg | Freq: Once | INTRAMUSCULAR | Status: DC | PRN

## 2018-09-19 NOTE — Progress Notes (Signed)
Patient ID: Sara Morse, female   DOB: 1995/09/13, 23 y.o.   MRN: [id number] Doing well Vitals:   09/18/18 2118 09/18/18 2312 09/19/18 0051 09/19/18 0218  BP: 135/68 (!) 101/49 (!) 143/54 (!) 135/57  Pulse: (!) 108 (!) 133 95 (!) 108  Resp: 17 16 16 16   Temp:      TempSrc:      Weight:      Height:       FHR reactive UCs irregular  Dilation: Fingertip Effacement (%): 80 Cervical Position: Posterior Station: Ballotable Presentation: Vertex Exam by:: Hansel Feinstein, CNM  Balloon still in  Since she was well effaced, will start some Pitocin since UCs are less frequent

## 2018-09-19 NOTE — Anesthesia Procedure Notes (Signed)
Epidural Patient location during procedure: OB Start time: 09/19/2018 5:41 PM End time: 09/19/2018 5:49 PM  Staffing Anesthesiologist: Josephine Igo, MD Performed: anesthesiologist   Preanesthetic Checklist Completed: patient identified, site marked, surgical consent, pre-op evaluation, timeout performed, IV checked, risks and benefits discussed and monitors and equipment checked  Epidural Patient position: sitting Prep: site prepped and draped and DuraPrep Patient monitoring: continuous pulse ox and blood pressure Approach: midline Location: L3-L4 Injection technique: LOR air  Needle:  Needle type: Tuohy  Needle gauge: 17 G Needle length: 9 cm and 9 Needle insertion depth: 5 cm cm Catheter type: closed end flexible Catheter size: 19 Gauge Catheter at skin depth: 10 cm Test dose: negative and Other  Assessment Events: blood not aspirated, injection not painful, no injection resistance, negative IV test and no paresthesia  Additional Notes Patient identified. Risks and benefits discussed including failed block, incomplete  Pain control, post dural puncture headache, nerve damage, paralysis, blood pressure Changes, nausea, vomiting, reactions to medications-both toxic and allergic and post Partum back pain. All questions were answered. Patient expressed understanding and wished to proceed. Sterile technique was used throughout procedure. Epidural site was Dressed with sterile barrier dressing. No paresthesias, signs of intravascular injection Or signs of intrathecal spread were encountered.  Patient was more comfortable after the epidural was dosed. Please see RN's note for documentation of vital signs and FHR which are stable. Reason for block:procedure for pain

## 2018-09-19 NOTE — Progress Notes (Signed)
Labor Progress Note Sara Morse is a 23 y.o. G2P0010 at [redacted]w[redacted]d presented for IOL for CHTN  S:  Patient comfortable with epidural  O:  BP (!) 145/85   Pulse (!) 112   Temp 98.4 F (36.9 C) (Oral)   Resp 18   Ht 5\' 4"  (1.626 m)   Wt 102.6 kg   LMP 11/25/2017   BMI 38.84 kg/m   Fetal Tracing:  Baseline: 140 Variability: moderate Accels: 15x15 Decels: none  Toco: 1-3    CVE: Dilation: 5 Effacement (%): 60 Cervical Position: Posterior Station: Ballotable Presentation: Vertex Exam by:: Glade Nurse, RNC   A&P: 23 y.o. G2P0010 [redacted]w[redacted]d IOL CHTN #Labor: Progressing well. Continue pitocin. Consider IUPC if unchanged on next exam #Pain: epidural #FWB: Cat 1 #GBS negative  Wende Mott, CNM 6:44 PM

## 2018-09-19 NOTE — Progress Notes (Signed)
Labor Progress Note Sara Morse is a 23 y.o. G2P0010 at 101w1d presented for IOL CHTN  S:  Patient comfortable  O:  BP 136/71   Pulse (!) 114   Temp 98.1 F (36.7 C) (Oral)   Resp 16   Ht 5\' 4"  (1.626 m)   Wt 102.6 kg   LMP 11/25/2017   BMI 38.84 kg/m   Fetal Tracing:  Baseline: 135 Variability: moderate Accels: 15x15 Decels: none  Toco: 2-5   CVE: Dilation: 4 Effacement (%): 60 Cervical Position: Posterior Station: -3 Presentation: Vertex Exam by:: Dr. Glo Herring   A&P: 23 y.o. G2P0010 [redacted]w[redacted]d IOL CHTN #Labor: Dr. Glo Herring at bedside to examine patient and attempt AROM. AROM with moderate amount of clear fluid. Patient tolerated procedure well. Will continue pitocin #Pain: per patient request #FWB: Cat 1 #GBS negative  Wende Mott, CNM

## 2018-09-19 NOTE — Anesthesia Preprocedure Evaluation (Addendum)
Anesthesia Evaluation  Patient identified by MRN, date of birth, ID band Patient awake    Reviewed: Allergy & Precautions, NPO status , Patient's Chart, lab work & pertinent test results  History of Anesthesia Complications Negative for: history of anesthetic complications  Airway Mallampati: II  TM Distance: >3 FB Neck ROM: Full    Dental no notable dental hx. (+) Teeth Intact   Pulmonary neg pulmonary ROS,    Pulmonary exam normal breath sounds clear to auscultation       Cardiovascular hypertension, Normal cardiovascular exam Rhythm:Regular Rate:Normal     Neuro/Psych PSYCHIATRIC DISORDERS Anxiety Depression negative neurological ROS     GI/Hepatic Neg liver ROS, GERD  Controlled,  Endo/Other  Obesity  Renal/GU negative Renal ROS     Musculoskeletal negative musculoskeletal ROS (+)   Abdominal (+) + obese,   Peds  Hematology negative hematology ROS (+)   Anesthesia Other Findings   Reproductive/Obstetrics (+) Pregnancy                            Anesthesia Physical Anesthesia Plan  ASA: II and emergent  Anesthesia Plan: Epidural   Post-op Pain Management:    Induction:   PONV Risk Score and Plan: 3 and Treatment may vary due to age or medical condition, Ondansetron and Dexamethasone  Airway Management Planned: Natural Airway  Additional Equipment:   Intra-op Plan:   Post-operative Plan:   Informed Consent: I have reviewed the patients History and Physical, chart, labs and discussed the procedure including the risks, benefits and alternatives for the proposed anesthesia with the patient or authorized representative who has indicated his/her understanding and acceptance.     Dental advisory given  Plan Discussed with: CRNA and Surgeon  Anesthesia Plan Comments:        Anesthesia Quick Evaluation

## 2018-09-19 NOTE — Progress Notes (Signed)
Labor Progress Note Sara Morse is a 23 y.o. G2P0010 at [redacted]w[redacted]d presented for IOL for CHTN  S:  Patient reports worsening contractions  O:  BP 134/65   Pulse 91   Temp 98.3 F (36.8 C) (Oral)   Resp 16   Ht 5\' 4"  (1.626 m)   Wt 102.6 kg   LMP 11/25/2017   BMI 38.84 kg/m   Fetal Tracing:  Baseline: 135 Variability: moderate Accels: 15x15 Decels: none  Toco: 4-5   CVE: Dilation: 4 Effacement (%): 60 Cervical Position: Posterior Station: Ballotable Presentation: Vertex Exam by:: Len Blalock, CNM    A&P: 23 y.o. G2P0010 [redacted]w[redacted]d IOL CHTN #Labor: Cervix unchanged, anterior behind pubic bone. Unable to AROM at this time, baby still ballotable. Will continue to titrate pitocin #Pain: per patient request #FWB: Cat 1 #GBS negative   Wende Mott, CNM 11:34 AM

## 2018-09-19 NOTE — Progress Notes (Signed)
Labor Progress Note Nakyia Dau is a 23 y.o. G2P0010 at [redacted]w[redacted]d presented for IOL for CHTN  S:  Patient sleeping  O:  BP 120/69   Pulse (!) 101   Temp 98.3 F (36.8 C) (Oral)   Resp 18   Ht 5\' 4"  (1.626 m)   Wt 102.6 kg   LMP 11/25/2017   BMI 38.84 kg/m   Fetal Tracing:  Baseline: 130 Variability: moderate Accels: 15x15 Decels: none  Toco: 2-4   CVE: Dilation: Fingertip Effacement (%): 80 Cervical Position: Posterior Station: Ballotable Presentation: Vertex Exam by:: Hansel Feinstein, CNM   A&P: 23 y.o. G2P0010 [redacted]w[redacted]d IOL CHTN #Labor: FB out this am. Will titrate pitocin 2x2 #Pain: per patient request #FWB: Cat 1 #GBS negative  Wende Mott, CNM 9:18 AM

## 2018-09-19 NOTE — Progress Notes (Signed)
LABOR PROGRESS NOTE  Sara Morse is a 23 y.o. G2P0010 at [redacted]w[redacted]d  admitted for IOL for cHTN.   Subjective: Patient comfortable with epidural. FOB at bedside.   Objective: BP 136/60   Pulse (!) 122   Temp 98.3 F (36.8 C) (Oral)   Resp 18   Ht 5\' 4"  (1.626 m)   Wt 102.6 kg   LMP 11/25/2017   BMI 38.84 kg/m  or  Vitals:   09/19/18 2100 09/19/18 2131 09/19/18 2201 09/19/18 2230  BP: (!) 107/56 124/82 133/75 136/60  Pulse: (!) 107 (!) 109 100 (!) 122  Resp: 18 18 18 18   Temp: 98.3 F (36.8 C)     TempSrc: Oral     Weight:      Height:        Dilation: 5 Effacement (%): 50 Cervical Position: Posterior Station: -2, -3 Presentation: Vertex Exam by:: Dr. Juleen China  FHT: baseline rate 145, moderate varibility, +acel, no decel Toco: q4-5 min (difficult to trace well on toco)   Labs: Lab Results  Component Value Date   WBC 12.2 (H) 09/19/2018   HGB 12.3 09/19/2018   HCT 37.8 09/19/2018   MCV 85.7 09/19/2018   PLT 256 09/19/2018    Patient Active Problem List   Diagnosis Date Noted  . Chronic hypertension 09/18/2018  . LGA (large for gestational age) fetus affecting management of mother 09/18/2018  . Supervision of high risk pregnancy, antepartum 02/16/2018  . Chronic hypertension affecting pregnancy 02/16/2018  . Anxiety and depression 05/27/2017    Assessment / Plan: 23 y.o. G2P0010 at [redacted]w[redacted]d here for IOL for cHTN. BPs normotensive.   Labor: Induction. Patient s/p FB and cytotec. Has been on Pitocin since 0200, currently at 30 mu/min without any cervical change since the afternoon. Will plan for 4 hour Pitocin break. Then will resume at 2 mu/min. Plan to place IUPC if does not change once Pitocin resumed.  Fetal Wellbeing:  Cat I  Pain Control:  Epidural in place  Anticipated MOD:  NSVD  Phill Myron, D.O. OB Fellow  09/19/2018, 10:53 PM

## 2018-09-20 ENCOUNTER — Encounter (HOSPITAL_COMMUNITY)
Admission: AD | Disposition: A | Discharge status: Home / Self Care | Payer: Self-pay | Source: Home / Self Care | Attending: Obstetrics and Gynecology

## 2018-09-20 ENCOUNTER — Encounter (HOSPITAL_COMMUNITY): Payer: Self-pay | Admitting: Anesthesiology

## 2018-09-20 DIAGNOSIS — O480 Post-term pregnancy: Secondary | ICD-10-CM

## 2018-09-20 DIAGNOSIS — Z3A40 40 weeks gestation of pregnancy: Secondary | ICD-10-CM

## 2018-09-20 DIAGNOSIS — O1002 Pre-existing essential hypertension complicating childbirth: Secondary | ICD-10-CM

## 2018-09-20 LAB — CBC
HCT: 33.3 % — ABNORMAL LOW (ref 36.0–46.0)
Hemoglobin: 10.9 g/dL — ABNORMAL LOW (ref 12.0–15.0)
MCH: 28.3 pg (ref 26.0–34.0)
MCHC: 32.7 g/dL (ref 30.0–36.0)
MCV: 86.5 fL (ref 80.0–100.0)
Platelets: 255 10*3/uL (ref 150–400)
RBC: 3.85 MIL/uL — ABNORMAL LOW (ref 3.87–5.11)
RDW: 13.5 % (ref 11.5–15.5)
WBC: 19.3 10*3/uL — ABNORMAL HIGH (ref 4.0–10.5)
nRBC: 0 % (ref 0.0–0.2)

## 2018-09-20 LAB — CREATININE, SERUM
Creatinine, Ser: 0.66 mg/dL (ref 0.44–1.00)
GFR calc Af Amer: >60 mL/min (ref >60)
GFR calc non Af Amer: >60 mL/min (ref >60)

## 2018-09-20 SURGERY — Surgical Case
Anesthesia: Epidural

## 2018-09-20 MED ORDER — MENTHOL 3 MG MT LOZG: 1.0000 | LOZENGE | OROMUCOSAL | Status: DC | PRN

## 2018-09-20 MED ORDER — SIMETHICONE 80 MG PO CHEW
80.0000 mg | CHEWABLE_TABLET | ORAL | Status: DC
  Administered 2018-09-20 – 2018-09-21 (×2): 80 mg via ORAL
  Filled 2018-09-20 (×2): qty 1

## 2018-09-20 MED ORDER — SENNOSIDES-DOCUSATE SODIUM 8.6-50 MG PO TABS
2.0000 | ORAL_TABLET | ORAL | Status: DC
  Administered 2018-09-20 – 2018-09-21 (×2): 2 via ORAL
  Filled 2018-09-20 (×2): qty 2

## 2018-09-20 MED ORDER — ONDANSETRON HCL 4 MG/2ML IJ SOLN
INTRAMUSCULAR | Status: AC
  Filled 2018-09-20: qty 2

## 2018-09-20 MED ORDER — SODIUM CHLORIDE 0.9 % IR SOLN
Status: DC | PRN
  Administered 2018-09-20: 1

## 2018-09-20 MED ORDER — FENTANYL CITRATE (PF) 100 MCG/2ML IJ SOLN
INTRAMUSCULAR | Status: DC | PRN
  Administered 2018-09-20: 100 ug via EPIDURAL
  Administered 2018-09-20: 100 ug via INTRAVENOUS

## 2018-09-20 MED ORDER — NALBUPHINE HCL 10 MG/ML IJ SOLN: 5.0000 mg | INTRAMUSCULAR | Status: DC | PRN

## 2018-09-20 MED ORDER — FENTANYL CITRATE (PF) 100 MCG/2ML IJ SOLN
INTRAMUSCULAR | Status: AC
  Filled 2018-09-20: qty 2

## 2018-09-20 MED ORDER — ENOXAPARIN SODIUM 60 MG/0.6ML ~~LOC~~ SOLN
50.0000 mg | SUBCUTANEOUS | Status: DC
  Administered 2018-09-21 – 2018-09-22 (×2): 50 mg via SUBCUTANEOUS
  Filled 2018-09-20 (×2): qty 0.6

## 2018-09-20 MED ORDER — CARBOPROST TROMETHAMINE 250 MCG/ML IM SOLN
INTRAMUSCULAR | Status: AC
  Filled 2018-09-20: qty 1

## 2018-09-20 MED ORDER — NALBUPHINE HCL 10 MG/ML IJ SOLN: 5.0000 mg | Freq: Once | INTRAMUSCULAR | Status: DC | PRN

## 2018-09-20 MED ORDER — DIPHENHYDRAMINE HCL 25 MG PO CAPS: 25.0000 mg | ORAL_CAPSULE | ORAL | Status: DC | PRN

## 2018-09-20 MED ORDER — ONDANSETRON HCL 4 MG/2ML IJ SOLN: 4.0000 mg | Freq: Three times a day (TID) | INTRAMUSCULAR | Status: DC | PRN

## 2018-09-20 MED ORDER — NALOXONE HCL 0.4 MG/ML IJ SOLN: 0.4000 mg | INTRAMUSCULAR | Status: DC | PRN

## 2018-09-20 MED ORDER — IBUPROFEN 800 MG PO TABS: 800.0000 mg | ORAL_TABLET | Freq: Four times a day (QID) | ORAL | Status: DC | PRN

## 2018-09-20 MED ORDER — SIMETHICONE 80 MG PO CHEW: 80.0000 mg | CHEWABLE_TABLET | ORAL | Status: DC | PRN

## 2018-09-20 MED ORDER — ACETAMINOPHEN 500 MG PO TABS
1000.0000 mg | ORAL_TABLET | Freq: Four times a day (QID) | ORAL | Status: AC
  Administered 2018-09-20 – 2018-09-21 (×4): 1000 mg via ORAL
  Filled 2018-09-20 (×4): qty 2

## 2018-09-20 MED ORDER — DIPHENHYDRAMINE HCL 50 MG/ML IJ SOLN: 12.5000 mg | INTRAMUSCULAR | Status: DC | PRN

## 2018-09-20 MED ORDER — CEFAZOLIN SODIUM-DEXTROSE 2-4 GM/100ML-% IV SOLN
INTRAVENOUS | Status: AC
  Filled 2018-09-20: qty 100

## 2018-09-20 MED ORDER — OXYCODONE-ACETAMINOPHEN 5-325 MG PO TABS: 1.0000 | ORAL_TABLET | ORAL | Status: DC | PRN

## 2018-09-20 MED ORDER — CARBOPROST TROMETHAMINE 250 MCG/ML IM SOLN
INTRAMUSCULAR | Status: DC | PRN
  Administered 2018-09-20: 250 ug via INTRAMUSCULAR

## 2018-09-20 MED ORDER — OXYTOCIN 40 UNITS IN NORMAL SALINE INFUSION - SIMPLE MED: 2.5000 [IU]/h | INTRAVENOUS | Status: AC

## 2018-09-20 MED ORDER — ACETAMINOPHEN 160 MG/5ML PO SOLN: 1000.0000 mg | Freq: Once | ORAL | Status: DC

## 2018-09-20 MED ORDER — DEXAMETHASONE SODIUM PHOSPHATE 4 MG/ML IJ SOLN
INTRAMUSCULAR | Status: AC
  Filled 2018-09-20: qty 1

## 2018-09-20 MED ORDER — PHENYLEPHRINE HCL (PRESSORS) 10 MG/ML IV SOLN
INTRAVENOUS | Status: DC | PRN
  Administered 2018-09-20: 40 ug via INTRAVENOUS

## 2018-09-20 MED ORDER — DIPHENHYDRAMINE HCL 25 MG PO CAPS: 25.0000 mg | ORAL_CAPSULE | Freq: Four times a day (QID) | ORAL | Status: DC | PRN

## 2018-09-20 MED ORDER — LACTATED RINGERS IV SOLN
INTRAVENOUS | Status: DC
  Administered 2018-09-20: 16:00:00 via INTRAVENOUS

## 2018-09-20 MED ORDER — KETOROLAC TROMETHAMINE 30 MG/ML IJ SOLN
30.0000 mg | Freq: Four times a day (QID) | INTRAMUSCULAR | Status: DC
  Administered 2018-09-20 – 2018-09-21 (×5): 30 mg via INTRAVENOUS
  Filled 2018-09-20 (×4): qty 1

## 2018-09-20 MED ORDER — BUPIVACAINE HCL (PF) 0.5 % IJ SOLN
INTRAMUSCULAR | Status: DC | PRN
  Administered 2018-09-20: 30 mL

## 2018-09-20 MED ORDER — OXYCODONE-ACETAMINOPHEN 5-325 MG PO TABS: 2.0000 | ORAL_TABLET | ORAL | Status: DC | PRN

## 2018-09-20 MED ORDER — WITCH HAZEL-GLYCERIN EX PADS: 1.0000 "application " | MEDICATED_PAD | CUTANEOUS | Status: DC | PRN

## 2018-09-20 MED ORDER — COCONUT OIL OIL: 1.0000 "application " | TOPICAL_OIL | Status: DC | PRN

## 2018-09-20 MED ORDER — PROMETHAZINE HCL 25 MG/ML IJ SOLN: 6.2500 mg | INTRAMUSCULAR | Status: DC | PRN

## 2018-09-20 MED ORDER — PHENYLEPHRINE 40 MCG/ML (10ML) SYRINGE FOR IV PUSH (FOR BLOOD PRESSURE SUPPORT)
PREFILLED_SYRINGE | INTRAVENOUS | Status: AC
  Filled 2018-09-20: qty 10

## 2018-09-20 MED ORDER — LIDOCAINE-EPINEPHRINE (PF) 2 %-1:200000 IJ SOLN
INTRAMUSCULAR | Status: DC | PRN
  Administered 2018-09-20 (×3): 5 mL via EPIDURAL

## 2018-09-20 MED ORDER — SODIUM CHLORIDE 0.9 % IV SOLN
INTRAVENOUS | Status: AC
  Filled 2018-09-20: qty 500

## 2018-09-20 MED ORDER — SIMETHICONE 80 MG PO CHEW
80.0000 mg | CHEWABLE_TABLET | Freq: Three times a day (TID) | ORAL | Status: DC
  Administered 2018-09-20 – 2018-09-22 (×5): 80 mg via ORAL
  Filled 2018-09-20 (×5): qty 1

## 2018-09-20 MED ORDER — BUPIVACAINE HCL (PF) 0.5 % IJ SOLN
INTRAMUSCULAR | Status: AC
  Filled 2018-09-20: qty 30

## 2018-09-20 MED ORDER — CEFAZOLIN SODIUM-DEXTROSE 2-4 GM/100ML-% IV SOLN
2.0000 g | Freq: Once | INTRAVENOUS | Status: AC
  Administered 2018-09-20: 2 g via INTRAVENOUS

## 2018-09-20 MED ORDER — SODIUM CHLORIDE 0.9% FLUSH: 3.0000 mL | INTRAVENOUS | Status: DC | PRN

## 2018-09-20 MED ORDER — KETOROLAC TROMETHAMINE 30 MG/ML IJ SOLN: 30.0000 mg | Freq: Four times a day (QID) | INTRAMUSCULAR | Status: AC | PRN

## 2018-09-20 MED ORDER — SODIUM CHLORIDE 0.9 % IV SOLN
500.0000 mg | Freq: Once | INTRAVENOUS | Status: AC
  Administered 2018-09-20: 500 mg via INTRAVENOUS

## 2018-09-20 MED ORDER — DEXAMETHASONE SODIUM PHOSPHATE 10 MG/ML IJ SOLN
INTRAMUSCULAR | Status: DC | PRN
  Administered 2018-09-20: 10 mg via INTRAVENOUS

## 2018-09-20 MED ORDER — ZOLPIDEM TARTRATE 5 MG PO TABS: 5.0000 mg | ORAL_TABLET | Freq: Every evening | ORAL | Status: DC | PRN

## 2018-09-20 MED ORDER — KETOROLAC TROMETHAMINE 30 MG/ML IJ SOLN
INTRAMUSCULAR | Status: AC
  Filled 2018-09-20: qty 1

## 2018-09-20 MED ORDER — PRENATAL MULTIVITAMIN CH
1.0000 | ORAL_TABLET | Freq: Every day | ORAL | Status: DC
  Administered 2018-09-21: 11:00:00 1 via ORAL
  Filled 2018-09-20: qty 1

## 2018-09-20 MED ORDER — ACETAMINOPHEN 500 MG PO TABS: 1000.0000 mg | ORAL_TABLET | Freq: Once | ORAL | Status: DC

## 2018-09-20 MED ORDER — NALOXONE HCL 4 MG/10ML IJ SOLN
1.0000 ug/kg/h | INTRAVENOUS | Status: DC | PRN
  Filled 2018-09-20: qty 5

## 2018-09-20 MED ORDER — MORPHINE SULFATE (PF) 0.5 MG/ML IJ SOLN
INTRAMUSCULAR | Status: AC
  Filled 2018-09-20: qty 10

## 2018-09-20 MED ORDER — DEXAMETHASONE SODIUM PHOSPHATE 10 MG/ML IJ SOLN
INTRAMUSCULAR | Status: AC
  Filled 2018-09-20: qty 1

## 2018-09-20 MED ORDER — DIBUCAINE (PERIANAL) 1 % EX OINT: 1.0000 "application " | TOPICAL_OINTMENT | CUTANEOUS | Status: DC | PRN

## 2018-09-20 MED ORDER — KETOROLAC TROMETHAMINE 30 MG/ML IJ SOLN: 30.0000 mg | Freq: Once | INTRAMUSCULAR | Status: DC

## 2018-09-20 MED ORDER — TETANUS-DIPHTH-ACELL PERTUSSIS 5-2.5-18.5 LF-MCG/0.5 IM SUSP: 0.5000 mL | Freq: Once | INTRAMUSCULAR | Status: DC

## 2018-09-20 MED ORDER — MORPHINE SULFATE (PF) 0.5 MG/ML IJ SOLN
INTRAMUSCULAR | Status: DC | PRN
  Administered 2018-09-20: 2 mg via EPIDURAL

## 2018-09-20 MED ORDER — ONDANSETRON HCL 4 MG/2ML IJ SOLN
INTRAMUSCULAR | Status: DC | PRN
  Administered 2018-09-20: 4 mg via INTRAVENOUS

## 2018-09-20 MED ORDER — FENTANYL CITRATE (PF) 100 MCG/2ML IJ SOLN: 25.0000 ug | INTRAMUSCULAR | Status: DC | PRN

## 2018-09-20 MED ORDER — SODIUM CHLORIDE 0.9 % IV SOLN
INTRAVENOUS | Status: DC | PRN
  Administered 2018-09-20: 13:00:00 via INTRAVENOUS

## 2018-09-20 SURGICAL SUPPLY — 38 items
BENZOIN TINCTURE PRP APPL 2/3 (GAUZE/BANDAGES/DRESSINGS) ×2 IMPLANT
CHLORAPREP W/TINT 26ML (MISCELLANEOUS) ×3 IMPLANT
CLAMP CORD UMBIL (MISCELLANEOUS) IMPLANT
CLOSURE STERI-STRIP 1/2X4 (GAUZE/BANDAGES/DRESSINGS) ×1
CLOSURE WOUND 1/2 X4 (GAUZE/BANDAGES/DRESSINGS)
CLOTH BEACON ORANGE TIMEOUT ST (SAFETY) ×3 IMPLANT
CLSR STERI-STRIP ANTIMIC 1/2X4 (GAUZE/BANDAGES/DRESSINGS) ×1 IMPLANT
DRSG OPSITE POSTOP 4X10 (GAUZE/BANDAGES/DRESSINGS) ×3 IMPLANT
ELECT REM PT RETURN 9FT ADLT (ELECTROSURGICAL) ×3
ELECTRODE REM PT RTRN 9FT ADLT (ELECTROSURGICAL) ×1 IMPLANT
EXTRACTOR VACUUM BELL STYLE (SUCTIONS) IMPLANT
GLOVE BIOGEL PI IND STRL 6.5 (GLOVE) ×1 IMPLANT
GLOVE BIOGEL PI IND STRL 7.0 (GLOVE) ×2 IMPLANT
GLOVE BIOGEL PI INDICATOR 6.5 (GLOVE) ×2
GLOVE BIOGEL PI INDICATOR 7.0 (GLOVE) ×4
GLOVE ORTHOPEDIC STR SZ6.5 (GLOVE) ×3 IMPLANT
GOWN STRL REUS W/TWL LRG LVL3 (GOWN DISPOSABLE) ×9 IMPLANT
KIT ABG SYR 3ML LUER SLIP (SYRINGE) IMPLANT
NDL HYPO 25X1 1.5 SAFETY (NEEDLE) IMPLANT
NEEDLE HYPO 22GX1.5 SAFETY (NEEDLE) ×3 IMPLANT
NEEDLE HYPO 25X1 1.5 SAFETY (NEEDLE) IMPLANT
NS IRRIG 1000ML POUR BTL (IV SOLUTION) ×3 IMPLANT
PACK C SECTION WH (CUSTOM PROCEDURE TRAY) ×3 IMPLANT
PAD ABD 8X10 STRL (GAUZE/BANDAGES/DRESSINGS) ×4 IMPLANT
PAD OB MATERNITY 4.3X12.25 (PERSONAL CARE ITEMS) ×3 IMPLANT
PENCIL SMOKE EVAC W/HOLSTER (ELECTROSURGICAL) ×3 IMPLANT
SPONGE LAP 18X18 RF (DISPOSABLE) ×2 IMPLANT
STRIP CLOSURE SKIN 1/2X4 (GAUZE/BANDAGES/DRESSINGS) IMPLANT
SUT MON AB 4-0 PS1 27 (SUTURE) ×3 IMPLANT
SUT PLAIN 2 0 (SUTURE) ×2
SUT PLAIN ABS 2-0 CT1 27XMFL (SUTURE) ×1 IMPLANT
SUT VIC AB 0 CT1 36 (SUTURE) ×6 IMPLANT
SUT VIC AB 0 CTX 36 (SUTURE) ×2
SUT VIC AB 0 CTX36XBRD ANBCTRL (SUTURE) ×1 IMPLANT
SYR CONTROL 10ML LL (SYRINGE) ×3 IMPLANT
TOWEL OR 17X24 6PK STRL BLUE (TOWEL DISPOSABLE) ×3 IMPLANT
TRAY FOLEY W/BAG SLVR 14FR LF (SET/KITS/TRAYS/PACK) ×3 IMPLANT
WATER STERILE IRR 1000ML POUR (IV SOLUTION) ×3 IMPLANT

## 2018-09-20 NOTE — Progress Notes (Signed)
Patient states she does not take zoloft, she did not like it.

## 2018-09-20 NOTE — Progress Notes (Signed)
LABOR PROGRESS NOTE  Sara Morse is a 23 y.o. G2P0010 at [redacted]w[redacted]d  admitted for IOL for cHTN.   Subjective: Patient comfortable with epidural. Sleeping when entered room.   Objective: BP 117/60   Pulse 92   Temp 98.6 F (37 C) (Oral)   Resp 18   Ht 5\' 4"  (1.626 m)   Wt 102.6 kg   LMP 11/25/2017   BMI 38.84 kg/m  or  Vitals:   09/20/18 0431 09/20/18 0501 09/20/18 0531 09/20/18 0601  BP: 126/71 130/62 (!) 121/59 117/60  Pulse: 94 88 90 92  Resp: 18 18 18 18   Temp:      TempSrc:      Weight:      Height:        Dilation: 5.5 Effacement (%): 50 Cervical Position: Posterior Station: -3 Presentation: Vertex Exam by:: B McClam, RN  FHT: baseline rate 125, moderate varibility, +acel, no decel Toco: q5-7 min   Labs: Lab Results  Component Value Date   WBC 12.2 (H) 09/19/2018   HGB 12.3 09/19/2018   HCT 37.8 09/19/2018   MCV 85.7 09/19/2018   PLT 256 09/19/2018    Patient Active Problem List   Diagnosis Date Noted  . Chronic hypertension 09/18/2018  . LGA (large for gestational age) fetus affecting management of mother 09/18/2018  . Supervision of high risk pregnancy, antepartum 02/16/2018  . Chronic hypertension affecting pregnancy 02/16/2018  . Anxiety and depression 05/27/2017    Assessment / Plan: 23 y.o. G2P0010 at [redacted]w[redacted]d here for IOL for cHTN. BPs normotensive.   Labor: Induction. Patient s/p FB and cytotec. Resumed Pitocin around 0300 after 4 hour Pit break. Now at 12 mu/min without cervical change, IUPC now in place. Will assess MVUs and titrate Pitocin as appropriate.  Fetal Wellbeing:  Cat I  Pain Control:  Epidural in place  Anticipated MOD:  Hopeful for NSVD  Phill Myron, D.O. OB Fellow  09/20/2018, 6:20 AM

## 2018-09-20 NOTE — Op Note (Signed)
Operative Note   SURGERY DATE: 09/20/2018  PRE-OP DIAGNOSIS:  Pregnancy at [redacted]w[redacted]d Failure to Progress  POST-OP DIAGNOSIS:  Pregnancy at [redacted]w[redacted]d Failure to Progress   PROCEDURE: primary low transverse cesarean section via pfannenstiel skin incision with double layer uterine closure  SURGEON: Surgeon(s) and Role: Sloan Leiter, MD - Primary  ASSISTANT:  Delaina Fetsch L, DO   ANESTHESIA: epidural  ESTIMATED BLOOD LOSS: 750 mL  DRAINS: indwelling foley  VTE PROPHYLAXIS: SCDs to bilateral lower extremities  ANTIBIOTICS: Two grams of Cefazolin were given and 500 mg Zithromax, within 1 hour of skin incision  SPECIMENS: None  COMPLICATIONS: Uterine Atony  INDICATIONS: Failure to progress, failed induction  FINDINGS: No intra-abdominal adhesions were noted. Grossly normal uterus, tubes and ovaries. clear amniotic fluid, cephalic female infant, weight 9#15, APGARs 8/9, intact placenta.  PROCEDURE IN DETAIL: The patient was taken to the operating room where anesthesia was administered and normal fetal heart tones were confirmed. She was then prepped and draped in the normal fashion in the dorsal supine position with a leftward tilt.  After a time out was performed, a pfannensteil skin incision was made with the scalpel and carried through to the underlying layer of fascia. The fascia was then incised at the midline and this incision was extended laterally with the mayo scissors. Attention was turned to the inferior aspect of the fascial incision which was grasped with the kocher clamps x 2, tented up and the rectus muscles were dissected off bluntly. In a similar fashion the superior aspect of the fascial incision was grasped with the kocher clamps, tented up and the rectus muscles dissected off with the mayo scissors. The rectus muscles were then separated in the midline and the peritoneum was entered bluntly. The Alexis retractor was inserted. The bladder blade was inserted and the  vesicouterine peritoneum was identified, tented up and entered with the metzenbaum scissors. This incision was extended laterally and the bladder flap was created digitally. The bladder blade was reinserted.  A low transverse hysterotomy was made with the scalpel until the endometrial cavity was breached and the amniotic sac ruptured, yielding clear amniotic fluid. This incision was extended bluntly and the infant's head, shoulders and body were delivered atraumatically.The cord was clamped x 2 and cut, and the infant was handed to the awaiting pediatricians, after delayed cord clamping was done.  The placenta was then gradually expressed from the uterus and then the uterus was cleared of all clots and debris. The hysterotomy was repaired with a running suture of 1-0 Vicryl. A second imbricating layer of 1-0 Vicryl suture was then placed. Excellent hemostasis was obtained.   The hysterotomy and all operative sites were reinspected and excellent hemostasis was noted. The fundus was noted to be firm, however the lower uterine segment was boggy, so Hemabate was given and the uterus was massaged firmly to attain greater tone. Once adequate uterine tone was obtained, the fascia was reapproximated with 0 Vicryl in a simple running fashion bilaterally. The subcutaneous layer was then reapproximated with 2-0 plain gut in a running fashion, and the skin was then closed with 4-0 monocryl, in a subcuticular fashion.  The patient tolerated the procedure well. Sponge, lap, needle, and instrument counts were correct x 2. The patient was transferred to the recovery room awake, alert and breathing independently in stable condition.  Merilyn Baba, DO OB Fellow [facility] for [employer] farm manager)

## 2018-09-20 NOTE — Progress Notes (Signed)
LABOR PROGRESS NOTE   Tawyna Pellot is a 23 y.o. G1P0 at [redacted]w[redacted]d  admitted for IOL for cHTN.   Subjective: Patient seen at bedside. She is comfortable with her epidural and only feeling slight pressure with her contractions.   Objective:   Dilation: 3 Effacement (%): 50 Cervical Position: Posterior Station: -3 Presentation: Vertex Exam by: Sanjana Folz FHT: baseline rate 145, moderate varibility, accels, no decels Toco: CTX q2-3 min     Assessment / Plan: Shaday Rayborn is a 23 y.o. G1P0 at 110w1d  admitted for IOL for cHTN. BPs have been normotensive.   Labor:  -Induction. -Patient s/p FB and cytotec.  -Resumed Pitocin around 0300 after 4 hour Pit break. Now at 28 mu/min without cervical change  -IUPC placed around 0600   Fetal Wellbeing:  Fetal status reassuring.    Pain Control:  Epidural in place   Anticipated MOD:  Hopeful for vaginal delivery, C/S as appropriate.   Mathis Dad, D.O. OB Fellow

## 2018-09-20 NOTE — Progress Notes (Signed)
[name] was referred for history of depression/anxiety. * Referral screened out by Clinical Social Worker because none of the following criteria appear to apply: ~ History of anxiety/depression during this pregnancy, or of post-partum depression following prior delivery. ~ Diagnosis of anxiety and/or depression within last 3 years OR * [name]'s symptoms currently being treated with medication and/or therapy. Per [name]'s H&P, [name] on Zoloft for anxiety and depression with active status at this time.    Please contact the Clinical Social Worker if needs arise, by [name] request, or if [name] scores greater than 9/yes to question 10 on Edinburgh Postpartum Depression Screen.      Sara Morse S. Sara Morse, MSW, LCSW [facility] [phone]   

## 2018-09-20 NOTE — Anesthesia Postprocedure Evaluation (Signed)
Anesthesia Post Note  Patient: Sara Morse  Procedure(s) Performed: CESAREAN SECTION (N/A )     Patient location during evaluation: PACU Anesthesia Type: Epidural Level of consciousness: awake and alert and oriented Pain management: pain level controlled Vital Signs Assessment: post-procedure vital signs reviewed and stable Respiratory status: spontaneous breathing, nonlabored ventilation and respiratory function stable Cardiovascular status: blood pressure returned to baseline Postop Assessment: no apparent nausea or vomiting and epidural receding Anesthetic complications: no    Last Vitals:  Vitals:   09/20/18 1500 09/20/18 1523  BP: 137/89 99/81  Pulse: (!) 115 (!) 102  Resp: (!) 28 20  Temp:  37.4 C  SpO2: 98% 98%    Last Pain:  Vitals:   09/20/18 1523  TempSrc: Axillary  PainSc: 2    Pain Goal:                Epidural/Spinal Function Cutaneous sensation: Normal sensation (09/20/18 1523), Patient able to flex knees: Yes (09/20/18 1523), Patient able to lift hips off bed: Yes (09/20/18 1523), Back pain beyond tenderness at insertion site: No (09/20/18 1523), Progressively worsening motor and/or sensory loss: No (09/20/18 1523), Bowel and/or bladder incontinence post epidural: No (09/20/18 1523)  Brennan Bailey

## 2018-09-20 NOTE — Progress Notes (Signed)
Faculty Note  S: In to meet patient, she is comfortable with epidural  O: BP 132/70   Pulse (!) 105   Temp 98.2 F (36.8 C) (Oral)   Resp 18   Ht 5\' 4"  (1.626 m)   Wt 102.6 kg   LMP 11/25/2017   BMI 38.84 kg/m   Gen: alert, oriented SVE: 3/50/-3  FHT: 140 bpm, moderate variability, accels  present, no decels Toco: ctx every few min   A/P: Pt is 23 y.o. G2P0010 @ [redacted]w[redacted]d who is admitted for induction of labor for chronic HTN. She is s/p 4 cytotec, foley bulb. Has been on pitocin for a total of 24 hrs, s/p AROM yesterday afternoon. IUPC placed around 6 this am with inadequate contractions since. Reviewed course with patient and husband, given lack of progress and inadequate MVUs at max dose of pitocin, offered options of continuing pitocin for several more hours to see if contractions can become adequate or proceeding with primary c-section, patient opts to proceed with primary c-section on my recommendation given the lack of progress with interventions.   The risks of cesarean section were discussed with the patient; including but not limited to: infection which may require antibiotics; bleeding which may require transfusion or re-operation; injury to bowel, bladder, ureters or other surrounding organs; injury to the fetus; need for additional procedures including hysterectomy in the event of a life-threatening hemorrhage; placental abnormalities wth subsequent pregnancies, risk of needing c-sections in future pregnancies, incisional problems, thromboembolic phenomenon and other postoperative/anesthesia complications. Answered all questions. The patient verbalized understanding of the plan, giving informed consent for the procedure. She is agreeable to blood transfusion in the event of emergency.  Anesthesia and OR aware Preoperative prophylactic antibiotics and SCDs ordered on call to the OR  To OR when ready Cat I tracing  K. Arvilla Meres, M.D. Attending Center for [employer]  Fish farm manager)

## 2018-09-20 NOTE — Transfer of Care (Signed)
Immediate Anesthesia Transfer of Care Note  Patient: Sara Morse  Procedure(s) Performed: CESAREAN SECTION (N/A )  Patient Location: PACU  Anesthesia Type:Epidural  Level of Consciousness: awake, alert , oriented and patient cooperative  Airway & Oxygen Therapy: Patient Spontanous Breathing  Post-op Assessment: Report given to RN and Post -op Vital signs reviewed and stable  Post vital signs: Reviewed and stable  Last Vitals:  Vitals Value Taken Time  BP 145/87 09/20/18 1406  Temp    Pulse 124 09/20/18 1409  Resp 26 09/20/18 1409  SpO2 98 % 09/20/18 1409  Vitals shown include unvalidated device data.  Last Pain:  Vitals:   09/20/18 1101  TempSrc:   PainSc: 0-No pain         Complications: No apparent anesthesia complications

## 2018-09-20 NOTE — Discharge Summary (Addendum)
OB Discharge Summary     Patient Name: Sara HuaSarah Morse DOB: 09-03-1995 MRN: [id number]  Date of admission: 09/18/2018 Delivering MD: Conan BowensAVIS, KELLY M   Date of discharge: 09/22/2018  Admitting diagnosis: pregnancy Intrauterine pregnancy: 6534w2d     Secondary diagnosis:  Active Problems:   Anxiety and depression   Chronic hypertension   LGA (large for gestational age) fetus affecting management of mother   Status post cesarean delivery  Additional problems: none     Discharge diagnosis: Term Pregnancy Delivered                                                                                                Post partum procedures:none  Augmentation: AROM, Pitocin, Cytotec and Foley Balloon  Complications: None  Hospital course:  Induction of Labor With Cesarean Section  23 y.o. yo G2P1011 at 4534w2d was admitted to the hospital 09/18/2018 for induction of labor. Patient had a labor course significant for failure to progress. The patient went for cesarean section due to failure to progress, and delivered a Viable infant,09/20/2018  Membrane Rupture Time/Date: 3:21 PM ,09/19/2018   Details of operation can be found in separate operative Note.  Patient had an uncomplicated postpartum course. She is ambulating, tolerating a regular diet, passing flatus, and urinating well.  Patient is discharged home in stable condition on 09/22/18.                                    Physical exam  Vitals:   09/21/18 1335 09/21/18 1951 09/21/18 2140 09/22/18 0616  BP: 131/77 129/74 132/89 135/71  Pulse: 97 91 (!) 109   Resp: 16 15 17 16   Temp: 97.7 F (36.5 C) 98.4 F (36.9 C) 98.4 F (36.9 C) 98.7 F (37.1 C)  TempSrc: Oral Oral Oral Oral  SpO2:  100%  100%  Weight:      Height:       General: alert, cooperative and no distress Lochia: appropriate Uterine Fundus: firm Incision: Healing well with no significant drainage DVT Evaluation: No evidence of DVT seen on physical exam. Labs: Lab Results   Component Value Date   WBC 14.9 (H) 09/21/2018   HGB 9.5 (L) 09/21/2018   HCT 29.2 (L) 09/21/2018   MCV 85.6 09/21/2018   PLT 242 09/21/2018   CMP Latest Ref Rng & Units 09/20/2018  Glucose 70 - 99 mg/dL -  BUN 6 - 20 mg/dL -  Creatinine 4.090.44 - 8.111.00 mg/dL 9.140.66  Sodium 782135 - 956145 mmol/L -  Potassium 3.5 - 5.1 mmol/L -  Chloride 98 - 111 mmol/L -  CO2 22 - 32 mmol/L -  Calcium 8.9 - 10.3 mg/dL -  Total Protein 6.5 - 8.1 g/dL -  Total Bilirubin 0.3 - 1.2 mg/dL -  Alkaline Phos 38 - 213126 U/L -  AST 15 - 41 U/L -  ALT 0 - 44 U/L -    Discharge instruction: per After Visit Summary and "Baby and Me Booklet".  After visit meds:  Allergies as of  09/22/2018   No Known Allergies     Medication List    TAKE these medications   ibuprofen 800 MG tablet Commonly known as: ADVIL Take 1 tablet (800 mg total) by mouth every 6 (six) hours as needed for moderate pain. Start taking on: September 24, 2018   norethindrone 0.35 MG tablet Commonly known as: MICRONOR Take 1 tablet (0.35 mg total) by mouth daily.   oxyCODONE-acetaminophen 5-325 MG tablet Commonly known as: PERCOCET/ROXICET Take 1-2 tablets by mouth every 4 (four) hours as needed for moderate pain.   Potassium Chloride ER 20 MEQ Tbcr Take 20 mEq by mouth daily.   Prenatal Plus Iron 29-1 MG Tabs Take 1 tablet by mouth daily.   sertraline 50 MG tablet Commonly known as: ZOLOFT Take 1 tablet (50 mg total) by mouth daily.       Diet: routine diet  Activity: Advance as tolerated. Pelvic rest for 6 weeks.   Outpatient follow up:1 week for BP and incision check then 4 weeks for Postpartum Follow up Appt: Future Appointments  Date Time Provider [place]  09/27/2018  2:30 PM Jonnie Kind, MD [facility]   Follow up Visit:No follow-ups on file.  Postpartum contraception: Progesterone only pills  Newborn Data: Live born female  Birth Weight: 9 lb 13.7 oz (4471 g) APGAR: 8, 9  Newborn Delivery   Birth  date/time: 09/20/2018 13:10:00 Delivery type: C-Section, Low Transverse Trial of labor: Yes C-section categorization: Primary      Baby Feeding: Breast Disposition:home with mother   09/22/2018 Hansel Feinstein, CNM  Note reopened to change actual discharge date Actual discharge date was 09/22/2018 Seabron Spates, CNM

## 2018-09-21 LAB — CBC
HCT: 29.2 % — ABNORMAL LOW (ref 36.0–46.0)
Hemoglobin: 9.5 g/dL — ABNORMAL LOW (ref 12.0–15.0)
MCH: 27.9 pg (ref 26.0–34.0)
MCHC: 32.5 g/dL (ref 30.0–36.0)
MCV: 85.6 fL (ref 80.0–100.0)
Platelets: 242 10*3/uL (ref 150–400)
RBC: 3.41 MIL/uL — ABNORMAL LOW (ref 3.87–5.11)
RDW: 13.3 % (ref 11.5–15.5)
WBC: 14.9 10*3/uL — ABNORMAL HIGH (ref 4.0–10.5)
nRBC: 0 % (ref 0.0–0.2)

## 2018-09-21 MED ORDER — IBUPROFEN 600 MG PO TABS
600.0000 mg | ORAL_TABLET | Freq: Four times a day (QID) | ORAL | Status: DC
  Administered 2018-09-21 – 2018-09-22 (×2): 600 mg via ORAL
  Filled 2018-09-21 (×2): qty 1

## 2018-09-21 NOTE — Lactation Note (Signed)
This note was copied from a baby's chart. Lactation Consultation Note  Patient Name: Sara Morse [id number] Date: 09/21/2018 Reason for consult: Primapara;1st time breastfeeding;Term;Other (Comment);Infant weight loss(large baby / see LC note regarding limited breast changes and concerns)  Baby is 32 hours old  As LC entered the room baby wide awake in the crib and very fussy. Per mom and dad the baby has never settled down after a feeding and we are wondering if he is getting enough due to his size.  Mom mentioned she is having pinching with latch.  LC offered to assess latch and he  if he his getting the depth when latched.  1st checked diaper dry, baby latched easily on the right breast and hand expressed before with drops of colostrum. Latched with depth and fed for about 12 mins and released , swallows noted and increased with breast compressions.  Baby released and LC offered to assist on the 2nd breast , baby latched for a short 5-7 mins feeding and released. Still very fussy. Mom asked for formula and expressed she wanted to see him settled down.  LC assisted to re- latch on the left and add the 38F SNS after baby latched and he took 12 ml easily - 15 mins feeding and was settled.  [place] set up the DEBP for post pumping with instructions.  And showed dad how to clean the 38F SNS.  LC reported to Waynard Edwards the [facility] plan.   LC agrees with supplementing - ideally the 38F SNS with the latch would be the best for increased stimulation for mom due to her decreased breast changes with pregnancy size wise. Only changes were the areola darkened, and the nipple got sensitive during the 1st trimester. Mom also showed LC the facial hair under her chin. Mom mentioned she has never been told she has PCOS but she wonders.   LC reassured mom with any mom its always a wait and see situation with milk supply .  The LC plan : recommendations:  Feed the baby STS  Feed with early feeding cues and latch  After  latching have dad insert the 5 F SNS in the side of the baby's mouth as shown.  By tonight increase to 20 ml and then gradually increase to 30 ml by  48 hours.  Post pump both breast for 15  - 20 mins , save milk for the next feeding.   MBURN Debbie Nix aware of the [facility] plan.     Maternal Data Has patient been taught Hand Expression?: Yes(seversal drops of colostrum noted - limited breast changes) Does the patient have breastfeeding experience prior to this delivery?: No  Feeding Feeding Type: Formula  LATCH Score Latch: Grasps breast easily, tongue down, lips flanged, rhythmical sucking.  Audible Swallowing: Spontaneous and intermittent(added 38F SNS see LC note)  Type of Nipple: Everted at rest and after stimulation  Comfort (Breast/Nipple): Soft / non-tender  Hold (Positioning): Assistance needed to correctly position infant at breast and maintain latch.  LATCH Score: 9  Interventions Interventions: Breast feeding basics reviewed;Assisted with latch;Skin to skin;Breast massage;Hand express;Breast compression;Adjust position;Support pillows;Position options;DEBP  Lactation Tools Discussed/Used Tools: Pump;38F feeding tube / Syringe Breast pump type: Double-Electric Breast Pump WIC Program: No Pump Review: Setup, frequency, and cleaning;Milk Storage Initiated by:: MAI Date initiated:: 09/21/18   Consult Status Consult Status: Follow-up Date: 09/22/18 Follow-up type: In-patient    [place] 09/21/2018, 3:17 PM

## 2018-09-21 NOTE — Progress Notes (Addendum)
Subjective: Postpartum Day 1: Cesarean Delivery Patient reports no problems overnight and is doing well. Pain is minimal. Patient is tolerating PO and + flatus but has not had a BM. She has not tried ambulating yet. Indwelling foley in place. Patient has been breastfeeding.  Objective: Vital signs in last 24 hours: Temp:  [98.2 F (36.8 C)-99.6 F (37.6 C)] 98.7 F (37.1 C) (08/18 0405) Pulse Rate:  [83-178] 100 (08/18 0405) Resp:  [14-52] 16 (08/18 0405) BP: (66-145)/(24-118) 125/73 (08/18 0405) SpO2:  [97 %-100 %] 100 % (08/18 0405)  Physical Exam:  General: alert, cooperative and no distress Lochia: appropriate Uterine Fundus: firm Incision: pressure dressing in place, intact/clean/dry DVT Evaluation: No evidence of DVT seen on physical exam. Negative Homan's sign. No cords or calf tenderness.  Recent Labs    09/20/18 1445 09/21/18 0555  HGB 10.9* 9.5*  HCT 33.3* 29.2*    Assessment/Plan: S/p Cesarean section day 1. Doing well postoperatively. Continue current care. Birth control: OCPs Feeding: breast - baby is latching but gets tired after a few minutes Dispo: plan for discharge POD#2  Annamary Carolin 09/21/2018, 7:49 AM   I confirm that I have verified the information documented in the physician assistant student's note and that I have also personally reperformed the history, physical exam and all medical decision making activities of this service and have verified that all service and findings are accurately documented in this student's note.    Lajean Manes, CNM 09/21/2018 8:51 AM

## 2018-09-22 DIAGNOSIS — Z98891 History of uterine scar from previous surgery: Secondary | ICD-10-CM

## 2018-09-22 MED ORDER — OXYCODONE-ACETAMINOPHEN 5-325 MG PO TABS: 1.0000 | ORAL_TABLET | ORAL | 0 refills | Status: DC | PRN

## 2018-09-22 MED ORDER — IBUPROFEN 800 MG PO TABS: 800.0000 mg | ORAL_TABLET | Freq: Four times a day (QID) | ORAL | 0 refills | Status: DC | PRN

## 2018-09-22 MED ORDER — NORETHINDRONE 0.35 MG PO TABS: 1.0000 | ORAL_TABLET | Freq: Every day | ORAL | 11 refills | Status: DC

## 2018-09-22 NOTE — Discharge Instructions (Signed)

## 2018-09-22 NOTE — Lactation Note (Signed)
This note was copied from a baby's chart. Lactation Consultation Note  Patient Name: Sara Morse [id number] Date: 09/22/2018 Reason for consult: 1st time breastfeeding;Follow-up assessment;Primapara;Term;Infant weight loss;Other (Comment)(7% weight loss)  Baby is 23 hours old  LC reviewed and updated the doc flow sheets .  Per mom I still want to work on the breast feeding , but I  Got overwhelmed last night and he was so fussy had to give a bottle. Mom also expressed she was very tired.  LC reviewed supply and demand and stressed why  Latching and extra pumping after 5 - 6 feedings a day are recommended due to her limited breast changes, wide spaced breast.  See LC 8/8 note.  Sore nipple and engorgement prevention and tx reviewed. Per mom has [place] at  Home.  Mom aware of the [facility] resources after D/C.    Maternal Data    Feeding Feeding Type: (per mom has not tried to breastfeed since yesterday - last evening - got overwhelmed) Nipple Type: Slow - flow  LATCH Score                   Interventions Interventions: Breast feeding basics reviewed  Lactation Tools Discussed/Used Tools: Pump Breast pump type: Double-Electric Breast Pump Pump Review: Milk Storage   Consult Status Consult Status: Complete Date: 09/22/18    Jerlyn Ly Brannon Levene 09/22/2018, 10:10 AM

## 2018-09-27 ENCOUNTER — Encounter: Payer: Self-pay | Admitting: Obstetrics and Gynecology

## 2018-09-27 ENCOUNTER — Ambulatory Visit (INDEPENDENT_AMBULATORY_CARE_PROVIDER_SITE_OTHER): Payer: Medicaid Other | Admitting: Obstetrics and Gynecology

## 2018-09-27 ENCOUNTER — Other Ambulatory Visit: Payer: Self-pay

## 2018-09-27 VITALS — BP 127/85 | HR 91 | Wt 198.6 lb

## 2018-09-27 DIAGNOSIS — Z09 Encounter for follow-up examination after completed treatment for conditions other than malignant neoplasm: Secondary | ICD-10-CM

## 2018-09-27 DIAGNOSIS — Z98891 History of uterine scar from previous surgery: Secondary | ICD-10-CM

## 2018-09-27 NOTE — Progress Notes (Signed)
Patient ID: Brandalynn Ofallon, female   DOB: 1995/12/08, 23 y.o.   MRN: [id number]  Subjective:  Nyoka Alcoser is a 23 y.o. female now 1 weeks status post c-section.    Review of Systems Negative except none   Diet:   normal   Bowel movements : normal.  Pain is controlled with current analgesics. Medications being used: ibuprofen (OTC).  Objective:   There were no vitals taken for this visit. BP 127/85 (BP Location: Left Arm, Patient Position: Sitting, Cuff Size: Normal)   Pulse 91   Wt 198 lb 9.6 oz (90.1 kg)   Breastfeeding No   BMI 34.09 kg/m   General:Well developed, well nourished.  No acute distress. Abdomen: Bowel sounds normal, soft, non-tender. Pelvic Exam: Not done  Incision(s): Healing well, no drainage, no erythema, no hernia, no swelling, no dehiscence.  Some dried blood around insicion with no purulence.     Assessment:  Post-Op 1 weeks s/p c-section   Doing well postoperatively.   Plan:  1.Wound care discussed  Acetone to remove tape adhesive, keep cloth to absorb moisture 2. PP visit, Plans for OCP has Rx. 4. return to work: not applicable. 5. Follow up in 4 weeks.  By signing my name below, I, Samul Dada, attest that this documentation has been prepared under the direction and in the presence of Jonnie Kind, MD. Electronically Signed: [place]. 09/27/18. 2:26 PM.  I personally performed the services described in this documentation, which was SCRIBED in my presence. The recorded information has been reviewed and considered accurate. It has been edited as necessary during review. Jonnie Kind, MD

## 2018-10-07 ENCOUNTER — Encounter: Payer: Self-pay | Admitting: *Deleted

## 2018-10-07 ENCOUNTER — Telehealth: Payer: Self-pay | Admitting: Obstetrics and Gynecology

## 2018-10-07 ENCOUNTER — Encounter: Payer: Self-pay | Admitting: Obstetrics and Gynecology

## 2018-10-07 ENCOUNTER — Other Ambulatory Visit: Payer: Self-pay

## 2018-10-07 ENCOUNTER — Ambulatory Visit (INDEPENDENT_AMBULATORY_CARE_PROVIDER_SITE_OTHER): Payer: Medicaid Other | Admitting: Obstetrics and Gynecology

## 2018-10-07 VITALS — BP 134/83 | HR 90 | Ht 64.0 in | Wt 189.4 lb

## 2018-10-07 DIAGNOSIS — Z09 Encounter for follow-up examination after completed treatment for conditions other than malignant neoplasm: Secondary | ICD-10-CM

## 2018-10-07 DIAGNOSIS — Z98891 History of uterine scar from previous surgery: Secondary | ICD-10-CM

## 2018-10-07 MED ORDER — AMOXICILLIN-POT CLAVULANATE 500-125 MG PO TABS: 1.0000 | ORAL_TABLET | Freq: Three times a day (TID) | ORAL | 0 refills | Status: DC

## 2018-10-07 NOTE — Progress Notes (Addendum)
Patient ID: Sara Morse, female   DOB: 1995/12/18, 23 y.o.   MRN: [id number]  Subjective:  Sara Morse is a 23 y.o. female now 2.5 weeks status post C section. She has been covering her incision with gauze because it has been draining fluid.  Review of Systems Negative   Diet: normal   Bowel movements : normal.  Objective:  BP 134/83 (BP Location: Left Arm, Patient Position: Sitting, Cuff Size: Normal)   Pulse 90   Ht 5\' 4"  (1.626 m)   Wt 189 lb 6.4 oz (85.9 kg)   Breastfeeding No   BMI 32.51 kg/m  General:Well developed, well nourished.  No acute distress. Abdomen: Bowel sounds normal, soft, non-tender. Pelvic Exam: DEFERRED  Incision(s): Almost fully healed on one side. Mild clear drainage and erythema. Does not appear to be infected.   Assessment:  Post-Op 2.5 weeks s/p C section.  Incision seroma, draining  Steri strips applied to incision today. Will escribe Augmentin as precaution    Plan:  1. Wound care discussed. Advised the pt to change dressing daily. Steri strips were applied today., may shower 2. Current medications. 3. Activity restrictions: no bending, stooping, or squatting and no overhead lifting 4. Return to work: not applicable. 5. Follow up in 1 week.  By signing my name below, I, De [place], attest that this documentation has been prepared under the direction and in the presence of Jonnie Kind, MD. Electronically Signed: De Burrs, Medical Scribe. 10/07/18. 2:53 PM.  I personally performed the services described in this documentation, which was SCRIBED in my presence. The recorded information has been reviewed and considered accurate. It has been edited as necessary during review. Jonnie Kind, MD

## 2018-10-07 NOTE — Telephone Encounter (Signed)
Pt left message that Augmentin has been called in to pharmacy.

## 2018-10-25 ENCOUNTER — Encounter: Payer: Self-pay | Admitting: Advanced Practice Midwife

## 2018-10-25 ENCOUNTER — Other Ambulatory Visit: Payer: Self-pay

## 2018-10-25 ENCOUNTER — Ambulatory Visit (INDEPENDENT_AMBULATORY_CARE_PROVIDER_SITE_OTHER): Payer: Medicaid Other | Admitting: Advanced Practice Midwife

## 2018-10-25 DIAGNOSIS — Z1389 Encounter for screening for other disorder: Secondary | ICD-10-CM

## 2018-10-25 MED ORDER — SERTRALINE HCL 50 MG PO TABS: 50.0000 mg | ORAL_TABLET | Freq: Every day | ORAL | 2 refills | Status: DC

## 2018-10-25 NOTE — Progress Notes (Signed)
Sara Morse is a 23 y.o. who presents for a postpartum visit. She is 5 weeks postpartum following a low cervical transverse Cesarean section. I have fully reviewed the prenatal and intrapartum course. The delivery was at 40.2 gestational weeks. IOL for [facility], CS for FTP. Baby was 9#13oz. Anesthesia: epidural. Postpartum course has been uneventful  Took ABX as a precaution at 2 week incision check d/t extra drainage. . Baby's course has been uneventful. Baby is feeding by bottle (just stopped BF). Bleeding: no bleeding. Bowel function is normal. Bladder function is normal. Patient is not sexually active. Contraception method is oral progesterone-only contraceptive. Postpartum depression screening: 14.  Had been on zoloft, worked well in the past.  Sara Morse on it during pg for about a week, thought is made her mean.  Will try it again.   Current Outpatient Medications:  .  ibuprofen (ADVIL) 800 MG tablet, Take 1 tablet (800 mg total) by mouth every 6 (six) hours as needed for moderate pain., Disp: 30 tablet, Rfl: 0 .  norethindrone (MICRONOR) 0.35 MG tablet, Take 1 tablet (0.35 mg total) by mouth daily., Disp: 1 Package, Rfl: 11  Review of Systems   Constitutional: Negative for fever and chills Eyes: Negative for visual disturbances Respiratory: Negative for shortness of breath, dyspnea Cardiovascular: Negative for chest pain or palpitations  Gastrointestinal: Negative for vomiting, diarrhea and constipation Genitourinary: Negative for dysuria and urgency Musculoskeletal: Negative for back pain, joint pain, myalgias  Neurological: Negative for dizziness and headaches    Objective:     Vitals:   10/25/18 1127  BP: 123/77  Pulse: 93   General:  alert, cooperative and no distress   Breasts:  negative  Lungs: Normal respiratory effort  Heart:  regular rate and rhythm  Abdomen: Soft, nontender.  Incision well healed.    Vulva:  normal  Vagina: normal vagina  Cervix:  closed  Corpus: Well  involuted     Rectal Exam: no hemorrhoids        Assessment:    normal postpartum exam.  Plan:   1. Contraception: oral progesterone-only contraceptive 2. Follow up in:  4 weeks if needed to increase zoloft dosage.  If too much BTB w/ POPs, will change to loloestrin

## 2018-11-14 ENCOUNTER — Encounter: Payer: Self-pay | Admitting: Advanced Practice Midwife

## 2019-08-26 ENCOUNTER — Other Ambulatory Visit: Payer: Self-pay | Admitting: Advanced Practice Midwife

## 2019-09-01 ENCOUNTER — Other Ambulatory Visit: Payer: Self-pay | Admitting: Advanced Practice Midwife

## 2019-09-01 MED ORDER — NORETHINDRONE 0.35 MG PO TABS: 1.0000 | ORAL_TABLET | Freq: Every day | ORAL | 11 refills | Status: DC

## 2019-11-15 ENCOUNTER — Other Ambulatory Visit: Payer: Self-pay | Admitting: Advanced Practice Midwife

## 2019-11-17 MED ORDER — SERTRALINE HCL 50 MG PO TABS: ORAL_TABLET | ORAL | 4 refills | Status: DC

## 2019-11-17 NOTE — Addendum Note (Signed)
Addended by: Jacklyn Shell on: 11/17/2019 08:16 PM   Modules accepted: Orders

## 2019-11-24 ENCOUNTER — Other Ambulatory Visit: Payer: Self-pay | Admitting: Advanced Practice Midwife

## 2019-11-24 MED ORDER — SERTRALINE HCL 50 MG PO TABS: ORAL_TABLET | ORAL | 4 refills | Status: DC

## 2020-08-09 ENCOUNTER — Other Ambulatory Visit: Payer: Self-pay | Admitting: Advanced Practice Midwife

## 2020-12-03 ENCOUNTER — Encounter: Payer: Self-pay | Admitting: Advanced Practice Midwife

## 2020-12-04 ENCOUNTER — Other Ambulatory Visit: Payer: Self-pay

## 2020-12-04 ENCOUNTER — Ambulatory Visit (INDEPENDENT_AMBULATORY_CARE_PROVIDER_SITE_OTHER): Payer: Medicaid Other | Admitting: Advanced Practice Midwife

## 2020-12-04 VITALS — BP 152/78 | HR 112 | Wt 231.0 lb

## 2020-12-04 DIAGNOSIS — Z3A10 10 weeks gestation of pregnancy: Secondary | ICD-10-CM

## 2020-12-04 DIAGNOSIS — O10919 Unspecified pre-existing hypertension complicating pregnancy, unspecified trimester: Secondary | ICD-10-CM

## 2020-12-04 DIAGNOSIS — Z98891 History of uterine scar from previous surgery: Secondary | ICD-10-CM | POA: Diagnosis not present

## 2020-12-04 DIAGNOSIS — O10911 Unspecified pre-existing hypertension complicating pregnancy, first trimester: Secondary | ICD-10-CM | POA: Diagnosis not present

## 2020-12-04 DIAGNOSIS — Z3A09 9 weeks gestation of pregnancy: Secondary | ICD-10-CM

## 2020-12-04 DIAGNOSIS — O0991 Supervision of high risk pregnancy, unspecified, first trimester: Secondary | ICD-10-CM

## 2020-12-04 DIAGNOSIS — O0990 Supervision of high risk pregnancy, unspecified, unspecified trimester: Secondary | ICD-10-CM

## 2020-12-04 LAB — HEPATITIS C ANTIBODY: HCV Ab: NEGATIVE

## 2020-12-04 MED ORDER — ASPIRIN EC 81 MG PO TBEC
81.0000 mg | DELAYED_RELEASE_TABLET | Freq: Every day | ORAL | 7 refills | Status: DC
Start: 2020-12-13 — End: 2021-05-28

## 2020-12-04 MED ORDER — LABETALOL HCL 200 MG PO TABS: 200.0000 mg | ORAL_TABLET | Freq: Two times a day (BID) | ORAL | 3 refills | Status: DC

## 2020-12-04 NOTE — Progress Notes (Signed)
Subjective:   Sara Morse is a 25 y.o. G3P1011 at [redacted]w[redacted]d by LMP being seen today for her first obstetrical visit.  Her obstetrical history is significant for  cesarean x 1 and CHTN, not currently on medications  and has Anxiety and depression; Chronic hypertension affecting pregnancy; Chronic hypertension; Status post cesarean delivery; and Postop check on their problem list.. Patient does intend to breast feed. Pregnancy history fully reviewed.  Patient reports nausea.  HISTORY: OB History  Gravida Para Term Preterm AB Living  3 1 1  0 1 1  SAB IAB Ectopic Multiple Live Births  0 0 0 0 1    # Outcome Date GA Lbr Len/2nd Weight Sex Delivery Anes PTL Lv  3 Current           2 Term 09/20/18 [redacted]w[redacted]d  9 lb 13.7 oz (4.471 kg) M CS-LTranv EPI  LIV     Name: Sara Morse     Apgar1: 8  Apgar5: 9  1 AB 2019           Past Medical History:  Diagnosis Date   Anxiety    Hypertension    Mental disorder    Past Surgical History:  Procedure Laterality Date   CESAREAN SECTION N/A 09/20/2018   Procedure: CESAREAN SECTION;  Surgeon: 09/22/2018, MD;  Location: [facility] LD ORS;  Service: Obstetrics;  Laterality: N/A;   DENTAL SURGERY     Family History  Problem Relation Age of Onset   Cancer Paternal Grandfather    Breast cancer Paternal Grandmother    Other Maternal Grandmother        brain cancer   Cancer Maternal Grandfather    Social History   Tobacco Use   Smoking status: Never   Smokeless tobacco: Never  Vaping Use   Vaping Use: Never used  Substance Use Topics   Alcohol use: Never   Drug use: Never   No Known Allergies Current Outpatient Medications on File Prior to Visit  Medication Sig Dispense Refill   Prenatal Vit-Fe Fumarate-FA (PRENATAL VITAMIN PO) Take by mouth.     sertraline (ZOLOFT) 50 MG tablet TAKE 1 TABLET(50 MG) BY MOUTH DAILY 90 tablet 4   No current facility-administered medications on file prior to visit.     Indications for ASA therapy (per  uptodate) One of the following: Previous pregnancy with preeclampsia, especially early onset and with an adverse outcome No Multifetal gestation No Chronic hypertension Yes Type 1 or 2 diabetes mellitus No Chronic kidney disease No Autoimmune disease (antiphospholipid syndrome, systemic lupus erythematosus) No   Indications for early 1 hour GTT (per uptodate)  BMI >25 (>23 in Asian women) AND one of the following  Gestational diabetes mellitus in a previous pregnancy No Glycated hemoglobin ?5.7 percent (39 mmol/mol), impaired glucose tolerance, or impaired fasting glucose on previous testing No First-degree relative with diabetes No High-risk race/ethnicity (eg, African American, Latino, Native American, [place]an [place] American, Pacific Islander) No History of cardiovascular disease No Hypertension or on therapy for hypertension Yes High-density lipoprotein cholesterol level <35 mg/dL ([place] mmol/L) and/or a triglyceride level >250 mg/dL (5.17 mmol/L) No Polycystic ovary syndrome No Physical inactivity No Other clinical condition associated with insulin resistance (eg, severe obesity, acanthosis nigricans) No Previous birth of an infant weighing ?4000 g No Previous stillbirth of unknown cause No Exam   Vitals:   12/04/20 1504  BP: (!) 152/78  Pulse: (!) 112  Weight: 231 lb (104.8 kg)   Fetal Heart Rate (bpm):  184  Uterus:     Pelvic Exam: Perineum: no hemorrhoids, normal perineum   Vulva: normal external genitalia, no lesions   Vagina:  normal mucosa, normal discharge   Cervix: no lesions and normal, pap smear done.    Adnexa: normal adnexa and no mass, fullness, tenderness   Bony Pelvis: average  System: General: well-developed, well-nourished female in no acute distress   Breast:  normal appearance, no masses or tenderness   Skin: normal coloration and turgor, no rashes   Neurologic: oriented, normal, negative, normal mood   Extremities: normal strength, tone, and muscle mass,  ROM of all joints is normal   HEENT PERRLA, extraocular movement intact and sclera clear, anicteric   Mouth/Teeth mucous membranes moist, pharynx normal without lesions and dental hygiene good   Neck supple and no masses   Cardiovascular: regular rate and rhythm   Respiratory:  no respiratory distress, normal breath sounds   Abdomen: soft, non-tender; bowel sounds normal; no masses,  no organomegaly     Assessment:   Pregnancy: G3P1011 Patient Active Problem List   Diagnosis Date Noted   Postop check 09/27/2018   Status post cesarean delivery 09/22/2018   Chronic hypertension 09/18/2018   Chronic hypertension affecting pregnancy 02/16/2018   Anxiety and depression 05/27/2017     Plan:   1. Chronic hypertension affecting pregnancy --Pt not on medications outside of pregnancy but does not have routine care or PCP --Given HTN 150s/70s at today's visit, will start low dose labetalol --BASA at 12 weeks  - labetalol (NORMODYNE) 200 MG tablet; Take 1 tablet (200 mg total) by mouth 2 (two) times daily.  Dispense: 60 tablet; Refill: 3 - aspirin EC 81 MG tablet; Take 1 tablet (81 mg total) by mouth daily. Swallow whole.  Dispense: 30 tablet; Refill: 7  2. History of cesarean section --Pt undecided about delivery this pregnancy  3. Supervision of high risk pregnancy, antepartum --Anticipatory guidance about next visits/weeks of pregnancy given. --next visit in 4 weeks  - Obstetric panel - HIV antibody (with reflex) - Hepatitis C Antibody - Hemoglobin A1c - Culture, OB Urine - [place] bedside; Future - Babyscripts Schedule Optimization - Comprehensive metabolic panel - Protein / creatinine ratio, urine  4. [redacted] weeks gestation of pregnancy  Initial labs drawn. Continue prenatal vitamins. Discussed and offered genetic screening options, including Quad screen/AFP, NIPS testing, and option to decline testing. Benefits/risks/alternatives reviewed. Pt aware that anatomy US is form of  genetic screening with lower accuracy in detecting trisomies than blood work.  Pt chooses/declines genetic screening today. NIPS: declined. Ultrasound discussed; fetal anatomic survey: requested. Problem list reviewed and updated. The nature of [facility] with multiple MDs and other Advanced Practice Providers was explained to patient; also emphasized that residents, students are part of our team. Routine obstetric precautions reviewed. No follow-ups on file.   Fatima Blank, CNM 12/04/20 5:35 PM

## 2020-12-04 NOTE — Progress Notes (Signed)
Bedside U/S shows single IUP with FHT of 184 BPM.  CRL measures 25.4mm GA is [redacted]w[redacted]d which is consistent with LMP

## 2020-12-05 LAB — OBSTETRIC PANEL
Absolute Monocytes: 581 cells/uL (ref 200–950)
Antibody Screen: NOT DETECTED
Basophils Absolute: 51 cells/uL (ref 0–200)
Basophils Relative: 0.5 %
Eosinophils Absolute: 71 cells/uL (ref 15–500)
Eosinophils Relative: 0.7 %
HCT: 40 % (ref 35.0–45.0)
Hemoglobin: 13.4 g/dL (ref 11.7–15.5)
Hepatitis B Surface Ag: NONREACTIVE
Lymphs Abs: 2417 cells/uL (ref 850–3900)
MCH: 29.3 pg (ref 27.0–33.0)
MCHC: 33.5 g/dL (ref 32.0–36.0)
MCV: 87.3 fL (ref 80.0–100.0)
MPV: 9.3 fL (ref 7.5–12.5)
Monocytes Relative: 5.7 %
Neutro Abs: 7079 cells/uL (ref 1500–7800)
Neutrophils Relative %: 69.4 %
Platelets: 382 10*3/uL (ref 140–400)
RBC: 4.58 10*6/uL (ref 3.80–5.10)
RDW: 12.6 % (ref 11.0–15.0)
RPR Ser Ql: NONREACTIVE
Rubella: 1.59 Index
Total Lymphocyte: 23.7 %
WBC: 10.2 10*3/uL (ref 3.8–10.8)

## 2020-12-05 LAB — HEPATITIS C ANTIBODY
Hepatitis C Ab: NONREACTIVE
SIGNAL TO CUT-OFF: 0.04 (ref <1.00)

## 2020-12-05 LAB — HEMOGLOBIN A1C
Hgb A1c MFr Bld: 5.3 % of total Hgb (ref <5.7)
Mean Plasma Glucose: 105 mg/dL
eAG (mmol/L): 5.8 mmol/L

## 2020-12-05 LAB — HIV ANTIBODY (ROUTINE TESTING W REFLEX): HIV 1&2 Ab, 4th Generation: NONREACTIVE

## 2020-12-06 LAB — CULTURE, OB URINE

## 2020-12-06 LAB — URINE CULTURE, OB REFLEX

## 2020-12-07 LAB — PROTEIN / CREATININE RATIO, URINE
Creatinine, Urine: 163 mg/dL (ref 20–275)
Protein/Creat Ratio: 74 mg/g creat (ref 24–184)
Protein/Creatinine Ratio: 0.074 mg/mg creat (ref 0.024–0.184)
Total Protein, Urine: 12 mg/dL (ref 5–24)

## 2020-12-07 LAB — COMPREHENSIVE METABOLIC PANEL
AG Ratio: 1.5 (calc) (ref 1.0–2.5)
ALT: 14 U/L (ref 6–29)
AST: 15 U/L (ref 10–30)
Albumin: 4.1 g/dL (ref 3.6–5.1)
Alkaline phosphatase (APISO): 72 U/L (ref 31–125)
BUN/Creatinine Ratio: 9 (calc) (ref 6–22)
BUN: 6 mg/dL — ABNORMAL LOW (ref 7–25)
CO2: 15 mmol/L — ABNORMAL LOW (ref 20–32)
Calcium: 9.7 mg/dL (ref 8.6–10.2)
Chloride: 107 mmol/L (ref 98–110)
Creat: 0.7 mg/dL (ref 0.50–0.96)
Globulin: 2.8 g/dL (calc) (ref 1.9–3.7)
Glucose, Bld: 83 mg/dL (ref 65–99)
Potassium: 4 mmol/L (ref 3.5–5.3)
Sodium: 139 mmol/L (ref 135–146)
Total Bilirubin: 0.3 mg/dL (ref 0.2–1.2)
Total Protein: 6.9 g/dL (ref 6.1–8.1)

## 2021-01-03 ENCOUNTER — Other Ambulatory Visit: Payer: Self-pay

## 2021-01-03 ENCOUNTER — Encounter: Payer: Self-pay | Admitting: Obstetrics and Gynecology

## 2021-01-03 ENCOUNTER — Telehealth: Payer: Self-pay | Admitting: *Deleted

## 2021-01-03 ENCOUNTER — Ambulatory Visit (INDEPENDENT_AMBULATORY_CARE_PROVIDER_SITE_OTHER): Payer: Medicaid Other | Admitting: Obstetrics and Gynecology

## 2021-01-03 ENCOUNTER — Other Ambulatory Visit (HOSPITAL_COMMUNITY)
Admission: RE | Admit: 2021-01-03 | Discharge: 2021-01-03 | Disposition: A | Discharge status: Home / Self Care | Payer: Medicaid Other | Source: Ambulatory Visit | Attending: Obstetrics and Gynecology | Admitting: Obstetrics and Gynecology

## 2021-01-03 VITALS — BP 133/80 | HR 91 | Wt 230.0 lb

## 2021-01-03 DIAGNOSIS — Z98891 History of uterine scar from previous surgery: Secondary | ICD-10-CM

## 2021-01-03 DIAGNOSIS — F32A Depression, unspecified: Secondary | ICD-10-CM

## 2021-01-03 DIAGNOSIS — Z23 Encounter for immunization: Secondary | ICD-10-CM | POA: Diagnosis not present

## 2021-01-03 DIAGNOSIS — O10919 Unspecified pre-existing hypertension complicating pregnancy, unspecified trimester: Secondary | ICD-10-CM

## 2021-01-03 DIAGNOSIS — O0990 Supervision of high risk pregnancy, unspecified, unspecified trimester: Secondary | ICD-10-CM | POA: Diagnosis present

## 2021-01-03 DIAGNOSIS — F419 Anxiety disorder, unspecified: Secondary | ICD-10-CM

## 2021-01-03 NOTE — Telephone Encounter (Signed)
Left patient detailed message with MFM appointment information, will print AVS at next appointment for instructions, address, number, date, and time.

## 2021-01-03 NOTE — Addendum Note (Signed)
Addended by: Kathie Dike on: 01/03/2021 03:10 PM   Modules accepted: Orders

## 2021-01-03 NOTE — Progress Notes (Signed)
   PRENATAL VISIT NOTE  Subjective:  Sara Morse is a 25 y.o. G3P1011 at [redacted]w[redacted]d being seen today for ongoing prenatal care.  She is currently monitored for the following issues for this high-risk pregnancy and has Anxiety and depression; Supervision of high risk pregnancy, antepartum; Chronic hypertension affecting pregnancy; and Status post cesarean delivery on their problem list.  Patient reports no complaints.  Contractions: Not present. Vag. Bleeding: None.  Movement: Absent. Denies leaking of fluid.   The following portions of the patient's history were reviewed and updated as appropriate: allergies, current medications, past family history, past medical history, past social history, past surgical history and problem list.   Objective:   Vitals:   01/03/21 1430  BP: 133/80  Pulse: 91  Weight: 230 lb (104.3 kg)    Fetal Status: Fetal Heart Rate (bpm): 171   Movement: Absent     General:  Alert, oriented and cooperative. Patient is in no acute distress.  Skin: Skin is warm and dry. No rash noted.   Cardiovascular: Normal heart rate noted  Respiratory: Normal respiratory effort, no problems with respiration noted  Abdomen: Soft, gravid, appropriate for gestational age.  Pain/Pressure: Absent     Pelvic: Cervical exam performed in the presence of a chaperone; normal bimanual exam, gravid uterus, pap smear done        Extremities: Normal range of motion.  Edema: None  Mental Status: Normal mood and affect. Normal behavior. Normal judgment and thought content.   Assessment and Plan:  Pregnancy: G3P1011 at [redacted]w[redacted]d 1. Chronic hypertension affecting pregnancy Normal baseline labs. P/C ratio 74, Cr 0.7. She is on ldASA Started on Labetalol 200 BID at new OB visit.  Discussed delivery no later than 39w. Reviewed antenatal monitoring later in pregnancy and serial growth [place].   2. Anxiety and depression Controlled on Zoloft  3. Supervision of high risk pregnancy, antepartum Offered  flu shot - pt accepts Anatomy US ordered - will do detailed for [facility] She decline spap today because she doesn't have time. Would like ot come on a Friday. Last pap was at least 2 years ago and normal per pt. None on file in [place].  Offer MSAFP next appt   4. Status post cesarean delivery - We discussed her history of c-section. Her previous c-section was due to  arrest of dilation at 3 cm.  Her baby was 9lb15oz. She has a history of no prior successful vaginal deliveries - After considering her options, she would like to have a repeat c-section   Preterm labor symptoms and general obstetric precautions including but not limited to vaginal bleeding, contractions, leaking of fluid and fetal movement were reviewed in detail with the patient. Please refer to After Visit Summary for other counseling recommendations.   Return in about 4 weeks (around 01/31/2021) for OB VISIT, MD or APP, Schedule [place] with MFM.  No future appointments.   Milas Hock, MD

## 2021-01-04 LAB — CERVICOVAGINAL ANCILLARY ONLY
Chlamydia: NEGATIVE
Comment: NEGATIVE
Comment: NORMAL
Neisseria Gonorrhea: NEGATIVE

## 2021-01-13 ENCOUNTER — Encounter: Payer: Self-pay | Admitting: Advanced Practice Midwife

## 2021-01-14 ENCOUNTER — Other Ambulatory Visit: Payer: Self-pay | Admitting: Advanced Practice Midwife

## 2021-01-14 DIAGNOSIS — O219 Vomiting of pregnancy, unspecified: Secondary | ICD-10-CM

## 2021-01-14 MED ORDER — METOCLOPRAMIDE HCL 10 MG PO TABS: 10.0000 mg | ORAL_TABLET | Freq: Four times a day (QID) | ORAL | 2 refills | Status: DC | PRN

## 2021-01-14 MED ORDER — DOXYLAMINE-PYRIDOXINE 10-10 MG PO TBEC: DELAYED_RELEASE_TABLET | ORAL | 5 refills | Status: DC

## 2021-01-14 NOTE — Progress Notes (Signed)
See MyChart message Rx for Diclegis and Reglan sent to pharmacy

## 2021-01-19 ENCOUNTER — Encounter: Payer: Self-pay | Admitting: Advanced Practice Midwife

## 2021-02-01 ENCOUNTER — Telehealth (INDEPENDENT_AMBULATORY_CARE_PROVIDER_SITE_OTHER): Payer: Medicaid Other | Admitting: Certified Nurse Midwife

## 2021-02-01 ENCOUNTER — Encounter: Payer: Self-pay | Admitting: Certified Nurse Midwife

## 2021-02-01 VITALS — BP 141/67

## 2021-02-01 DIAGNOSIS — O10912 Unspecified pre-existing hypertension complicating pregnancy, second trimester: Secondary | ICD-10-CM

## 2021-02-01 DIAGNOSIS — O0992 Supervision of high risk pregnancy, unspecified, second trimester: Secondary | ICD-10-CM

## 2021-02-01 DIAGNOSIS — O34219 Maternal care for unspecified type scar from previous cesarean delivery: Secondary | ICD-10-CM

## 2021-02-01 DIAGNOSIS — O10919 Unspecified pre-existing hypertension complicating pregnancy, unspecified trimester: Secondary | ICD-10-CM

## 2021-02-01 DIAGNOSIS — O0990 Supervision of high risk pregnancy, unspecified, unspecified trimester: Secondary | ICD-10-CM

## 2021-02-01 DIAGNOSIS — Z98891 History of uterine scar from previous surgery: Secondary | ICD-10-CM

## 2021-02-01 DIAGNOSIS — Z3A18 18 weeks gestation of pregnancy: Secondary | ICD-10-CM

## 2021-02-01 DIAGNOSIS — O210 Mild hyperemesis gravidarum: Secondary | ICD-10-CM

## 2021-02-01 NOTE — Progress Notes (Signed)
° °  OBSTETRICS PRENATAL VIRTUAL VISIT ENCOUNTER NOTE  Provider location: Center for [facility] [place]   Patient location: Home  I connected with Sara Morse on 02/01/21 at  9:30 AM EST by MyChart Video Encounter and verified that I am speaking with the correct person using two identifiers. I discussed the limitations, risks, security and privacy concerns of performing an evaluation and management service virtually and the availability of in person appointments. I also discussed with the patient that there may be a patient responsible charge related to this service. The patient expressed understanding and agreed to proceed. Subjective:  Sara Morse is a 25 y.o. G3P1011 at [redacted]w[redacted]d being seen today for ongoing prenatal care.  She is currently monitored for the following issues for this high-risk pregnancy and has Anxiety and depression; Supervision of high risk pregnancy, antepartum; Chronic hypertension affecting pregnancy; and Status post cesarean delivery on their problem list.  Patient reports no complaints.  Contractions: Not present. Vag. Bleeding: None.  Movement: Present. Denies any leaking of fluid.   The following portions of the patient's history were reviewed and updated as appropriate: allergies, current medications, past family history, past medical history, past social history, past surgical history and problem list.   Objective:   Vitals:   02/01/21 0919  BP: (!) 141/67    Fetal Status:     Movement: Present     General:  Alert, oriented and cooperative. Patient is in no acute distress.  Respiratory: Normal respiratory effort, no problems with respiration noted  Mental Status: Normal mood and affect. Normal behavior. Normal judgment and thought content.  Rest of physical exam deferred due to type of encounter  Imaging: No results found.  Assessment and Plan:  Pregnancy: G3P1011 at [redacted]w[redacted]d 1. Status post cesarean delivery - wants repeat, doesn't want  more children  2. Supervision of high risk pregnancy, antepartum  3. Chronic hypertension affecting pregnancy - well controlled on Labetalol  4. [redacted] weeks gestation of pregnancy  - anatomy US next week  5. Morning sickness - improved with Reglan  Preterm labor symptoms and general obstetric precautions including but not limited to vaginal bleeding, contractions, leaking of fluid and fetal movement were reviewed in detail with the patient. I discussed the assessment and treatment plan with the patient. The patient was provided an opportunity to ask questions and all were answered. The patient agreed with the plan and demonstrated an understanding of the instructions. The patient was advised to call back or seek an in-person office evaluation/go to [facility] at [facility] for any urgent or concerning symptoms. Please refer to After Visit Summary for other counseling recommendations.   I provided 9 minutes of face-to-face time during this encounter.  Return in about 4 weeks (around 03/01/2021).  Future Appointments  Date Time Provider Department Center  02/07/2021  1:15 PM [facility] NURSE [facility] [place] [facility]  02/07/2021  1:30 PM [facility] US3 [facility]    Donette Larry, CNM Center for [employer], [facility]

## 2021-02-03 ENCOUNTER — Encounter: Payer: Self-pay | Admitting: Certified Nurse Midwife

## 2021-02-07 ENCOUNTER — Other Ambulatory Visit: Payer: Self-pay

## 2021-02-07 ENCOUNTER — Ambulatory Visit: Payer: Medicaid Other | Admitting: *Deleted

## 2021-02-07 ENCOUNTER — Ambulatory Visit: Payer: Medicaid Other | Attending: Obstetrics and Gynecology

## 2021-02-07 VITALS — BP 123/61 | HR 86

## 2021-02-07 DIAGNOSIS — O0990 Supervision of high risk pregnancy, unspecified, unspecified trimester: Secondary | ICD-10-CM | POA: Diagnosis present

## 2021-02-07 DIAGNOSIS — O99212 Obesity complicating pregnancy, second trimester: Secondary | ICD-10-CM | POA: Diagnosis present

## 2021-02-07 DIAGNOSIS — O10919 Unspecified pre-existing hypertension complicating pregnancy, unspecified trimester: Secondary | ICD-10-CM | POA: Insufficient documentation

## 2021-02-08 ENCOUNTER — Other Ambulatory Visit: Payer: Self-pay | Admitting: *Deleted

## 2021-02-08 DIAGNOSIS — O34219 Maternal care for unspecified type scar from previous cesarean delivery: Secondary | ICD-10-CM

## 2021-02-08 DIAGNOSIS — O09299 Supervision of pregnancy with other poor reproductive or obstetric history, unspecified trimester: Secondary | ICD-10-CM

## 2021-02-08 DIAGNOSIS — O10912 Unspecified pre-existing hypertension complicating pregnancy, second trimester: Secondary | ICD-10-CM

## 2021-02-08 DIAGNOSIS — Z6839 Body mass index (BMI) 39.0-39.9, adult: Secondary | ICD-10-CM

## 2021-02-22 ENCOUNTER — Other Ambulatory Visit: Payer: Self-pay | Admitting: Advanced Practice Midwife

## 2021-02-26 ENCOUNTER — Other Ambulatory Visit: Payer: Self-pay | Admitting: Advanced Practice Midwife

## 2021-03-01 ENCOUNTER — Other Ambulatory Visit: Payer: Self-pay

## 2021-03-01 ENCOUNTER — Ambulatory Visit (INDEPENDENT_AMBULATORY_CARE_PROVIDER_SITE_OTHER): Payer: Medicaid Other | Admitting: Certified Nurse Midwife

## 2021-03-01 VITALS — BP 128/75 | HR 94 | Wt 232.0 lb

## 2021-03-01 DIAGNOSIS — O99212 Obesity complicating pregnancy, second trimester: Secondary | ICD-10-CM

## 2021-03-01 DIAGNOSIS — O09299 Supervision of pregnancy with other poor reproductive or obstetric history, unspecified trimester: Secondary | ICD-10-CM | POA: Insufficient documentation

## 2021-03-01 DIAGNOSIS — Z3A22 22 weeks gestation of pregnancy: Secondary | ICD-10-CM

## 2021-03-01 DIAGNOSIS — O10919 Unspecified pre-existing hypertension complicating pregnancy, unspecified trimester: Secondary | ICD-10-CM

## 2021-03-01 DIAGNOSIS — O0990 Supervision of high risk pregnancy, unspecified, unspecified trimester: Secondary | ICD-10-CM

## 2021-03-01 DIAGNOSIS — F419 Anxiety disorder, unspecified: Secondary | ICD-10-CM

## 2021-03-01 DIAGNOSIS — F32A Depression, unspecified: Secondary | ICD-10-CM

## 2021-03-01 DIAGNOSIS — O99210 Obesity complicating pregnancy, unspecified trimester: Secondary | ICD-10-CM | POA: Insufficient documentation

## 2021-03-01 MED ORDER — SERTRALINE HCL 100 MG PO TABS: 100.0000 mg | ORAL_TABLET | Freq: Every day | ORAL | 2 refills | Status: DC

## 2021-03-01 NOTE — Progress Notes (Signed)
Subjective:  Sara Morse is a 25 y.o. G3P1011 at [redacted]w[redacted]d being seen today for ongoing prenatal care.  She is currently monitored for the following issues for this high-risk pregnancy and has Anxiety and depression; Supervision of high risk pregnancy, antepartum; Chronic hypertension affecting pregnancy; Status post cesarean delivery; Obesity affecting pregnancy; and History of macrosomia in infant in prior pregnancy, currently pregnant on their problem list.  Patient reports  increased anxiety and feeling frustrated . Denies SI/HI. Contractions: Not present. Vag. Bleeding: None.  Movement: Present. Denies leaking of fluid.   The following portions of the patient's history were reviewed and updated as appropriate: allergies, current medications, past family history, past medical history, past social history, past surgical history and problem list. Problem list updated.  Objective:   Vitals:   03/01/21 0906  BP: 128/75  Pulse: 94  Weight: 232 lb (105.2 kg)    Fetal Status: Fetal Heart Rate (bpm): 159   Movement: Present     General:  Alert, oriented and cooperative. Patient is in no acute distress.  Skin: Skin is warm and dry. No rash noted.   Cardiovascular: Normal heart rate noted  Respiratory: Normal respiratory effort, no problems with respiration noted  Abdomen: Soft, gravid, appropriate for gestational age. Pain/Pressure: Absent     Pelvic: Vag. Bleeding: None Vag D/C Character: Thin   Cervical exam deferred        Extremities: Normal range of motion.  Edema: None  Mental Status: Normal mood and affect. Normal behavior. Normal judgment and thought content.   Urinalysis:      Assessment and Plan:  Pregnancy: G3P1011 at [redacted]w[redacted]d  1. Supervision of high risk pregnancy, antepartum  2. Chronic hypertension affecting pregnancy - stable on Labetalol 200 bid  3. Anxiety and depression - pt requesting to increase dose - sertraline (ZOLOFT) 100 MG tablet; Take 1 tablet (100 mg total)  by mouth daily.  Dispense: 30 tablet; Refill: 2 - f/u in 3-4 week for effectiveness  4. Obesity affecting pregnancy in second trimester - growth [place] scheduled with MFM  5. History of macrosomia in infant in prior pregnancy, currently pregnant - previous CS, planning repeat  6. [redacted] weeks gestation of pregnancy   Preterm labor symptoms and general obstetric precautions including but not limited to vaginal bleeding, contractions, leaking of fluid and fetal movement were reviewed in detail with the patient. Please refer to After Visit Summary for other counseling recommendations.  Return in about 4 weeks (around 03/29/2021).   Julianne Handler, CNM

## 2021-03-07 ENCOUNTER — Ambulatory Visit: Payer: Medicaid Other

## 2021-03-11 ENCOUNTER — Ambulatory Visit: Payer: Medicaid Other

## 2021-03-13 ENCOUNTER — Ambulatory Visit: Payer: Medicaid Other

## 2021-03-13 ENCOUNTER — Ambulatory Visit: Payer: Medicaid Other | Admitting: *Deleted

## 2021-03-13 ENCOUNTER — Ambulatory Visit: Payer: Medicaid Other | Attending: Obstetrics

## 2021-03-13 ENCOUNTER — Ambulatory Visit (HOSPITAL_BASED_OUTPATIENT_CLINIC_OR_DEPARTMENT_OTHER): Payer: Medicaid Other | Admitting: Maternal & Fetal Medicine

## 2021-03-13 ENCOUNTER — Other Ambulatory Visit: Payer: Self-pay

## 2021-03-13 VITALS — BP 133/58 | HR 79

## 2021-03-13 DIAGNOSIS — Z369 Encounter for antenatal screening, unspecified: Secondary | ICD-10-CM

## 2021-03-13 DIAGNOSIS — O09299 Supervision of pregnancy with other poor reproductive or obstetric history, unspecified trimester: Secondary | ICD-10-CM | POA: Diagnosis present

## 2021-03-13 DIAGNOSIS — O99212 Obesity complicating pregnancy, second trimester: Secondary | ICD-10-CM | POA: Diagnosis not present

## 2021-03-13 DIAGNOSIS — Q048 Other specified congenital malformations of brain: Secondary | ICD-10-CM | POA: Insufficient documentation

## 2021-03-13 DIAGNOSIS — O34219 Maternal care for unspecified type scar from previous cesarean delivery: Secondary | ICD-10-CM

## 2021-03-13 DIAGNOSIS — O10012 Pre-existing essential hypertension complicating pregnancy, second trimester: Secondary | ICD-10-CM | POA: Diagnosis not present

## 2021-03-13 DIAGNOSIS — O10919 Unspecified pre-existing hypertension complicating pregnancy, unspecified trimester: Secondary | ICD-10-CM | POA: Diagnosis not present

## 2021-03-13 DIAGNOSIS — O10912 Unspecified pre-existing hypertension complicating pregnancy, second trimester: Secondary | ICD-10-CM | POA: Insufficient documentation

## 2021-03-13 DIAGNOSIS — O09292 Supervision of pregnancy with other poor reproductive or obstetric history, second trimester: Secondary | ICD-10-CM

## 2021-03-13 DIAGNOSIS — Z3A23 23 weeks gestation of pregnancy: Secondary | ICD-10-CM

## 2021-03-13 DIAGNOSIS — Z6839 Body mass index (BMI) 39.0-39.9, adult: Secondary | ICD-10-CM | POA: Insufficient documentation

## 2021-03-13 NOTE — Progress Notes (Signed)
MFM Brief Note  [name] is a G3P1 who is here for follow up follow up growth and anatomy.  Her pregnancy is complicated by an elevated BMI, chronic hypertension (recently started on Labetalol).  She is seen at the request of Dr. Milas Hock.  Normal interval growth with measurements consistent with dates Good fetal movement and amniotic fluid volume   [name] was recently prescribed labetalol 200 mg BID for blood pressure management. Her blood pressure was 133/58 mmHg We discussed the increased risk for preeclampsia, placental abruption and fetal growth restriction in women with chronic hypertension. She is taking low dose ASA for preeclampsia prevention. I explained that serial growth exams should be performed every 4 weeks throughout the pregnancy. Medical therapy should be increased if blood pressure persist >150/100's, with the goal of an average BP of 135/85 mmHg.   Given the requirement of medical therapy  we recommend weekly testing at 32 weeks.  She is aware of the s/sx or preeclampsia including headache, vision changes and right upper quadrant pain.  Baseline labs of UPC, CBC, and CMP were performed on  and normal.   Secondly, today we observed as in the previous exam a prominent cysterna magna of 8 mm. I explained the normal nature of today's exam but recommend reassessment at future growth exam.  I reviewed the etiology of a large cysterna magna or also known as a mega cysterna defined by >10 mm. There are no markers of aneuploidy or additional intracranial findings.   I explained the differential diagnosis included a normal finding, dandy walker variant, blake pouch cyst, arachnoid cyst and vermian agenesis or hypoplasia.   [name] had originally declined genetic screening, however, given today's finding she desired non-invasive cell free DNA today.  I have scheduled [name] to return in 4 weeks for growth and to reassess the posterior fossa.  I spent 30  minutes with > 50% in face to face consultation.  Novella Olive, MD

## 2021-03-14 ENCOUNTER — Other Ambulatory Visit: Payer: Self-pay | Admitting: *Deleted

## 2021-03-14 DIAGNOSIS — O34219 Maternal care for unspecified type scar from previous cesarean delivery: Secondary | ICD-10-CM

## 2021-03-14 DIAGNOSIS — R638 Other symptoms and signs concerning food and fluid intake: Secondary | ICD-10-CM

## 2021-03-14 DIAGNOSIS — Z3689 Encounter for other specified antenatal screening: Secondary | ICD-10-CM

## 2021-03-14 DIAGNOSIS — O10912 Unspecified pre-existing hypertension complicating pregnancy, second trimester: Secondary | ICD-10-CM

## 2021-03-17 LAB — MATERNIT21 PLUS CORE+SCA
Fetal Fraction: 9
Monosomy X (Turner Syndrome): NOT DETECTED
Result (T21): NEGATIVE
Trisomy 13 (Patau syndrome): NEGATIVE
Trisomy 18 (Edwards syndrome): NEGATIVE
Trisomy 21 (Down syndrome): NEGATIVE
XXX (Triple X Syndrome): NOT DETECTED
XXY (Klinefelter Syndrome): NOT DETECTED
XYY (Jacobs Syndrome): NOT DETECTED

## 2021-03-18 ENCOUNTER — Encounter: Payer: Self-pay | Admitting: Maternal & Fetal Medicine

## 2021-03-18 ENCOUNTER — Encounter: Payer: Self-pay | Admitting: Certified Nurse Midwife

## 2021-03-29 ENCOUNTER — Encounter: Payer: Medicaid Other | Admitting: Obstetrics and Gynecology

## 2021-04-02 ENCOUNTER — Other Ambulatory Visit: Payer: Self-pay

## 2021-04-02 ENCOUNTER — Ambulatory Visit (INDEPENDENT_AMBULATORY_CARE_PROVIDER_SITE_OTHER): Payer: Medicaid Other | Admitting: Obstetrics and Gynecology

## 2021-04-02 ENCOUNTER — Encounter: Payer: Self-pay | Admitting: Obstetrics and Gynecology

## 2021-04-02 VITALS — BP 130/43 | HR 91 | Wt 231.0 lb

## 2021-04-02 DIAGNOSIS — Z98891 History of uterine scar from previous surgery: Secondary | ICD-10-CM

## 2021-04-02 DIAGNOSIS — O0990 Supervision of high risk pregnancy, unspecified, unspecified trimester: Secondary | ICD-10-CM

## 2021-04-02 DIAGNOSIS — O10919 Unspecified pre-existing hypertension complicating pregnancy, unspecified trimester: Secondary | ICD-10-CM

## 2021-04-02 DIAGNOSIS — O99212 Obesity complicating pregnancy, second trimester: Secondary | ICD-10-CM

## 2021-04-02 NOTE — Progress Notes (Signed)
° °  PRENATAL VISIT NOTE  Subjective:  Sara Morse is a 26 y.o. G3P1011 at [redacted]w[redacted]d being seen today for ongoing prenatal care.  She is currently monitored for the following issues for this high-risk pregnancy and has Anxiety and depression; Supervision of high risk pregnancy, antepartum; Chronic hypertension affecting pregnancy; Status post cesarean delivery; Obesity affecting pregnancy; and History of macrosomia in infant in prior pregnancy, currently pregnant on their problem list.  Patient reports no complaints.  Contractions: Not present. Vag. Bleeding: None.  Movement: Present. Denies leaking of fluid.   The following portions of the patient's history were reviewed and updated as appropriate: allergies, current medications, past family history, past medical history, past social history, past surgical history and problem list.   Objective:   Vitals:   04/02/21 1048  BP: (!) 130/43  Pulse: 91  Weight: 231 lb (104.8 kg)    Fetal Status: Fetal Heart Rate (bpm): 155 Fundal Height: 26 cm Movement: Present     General:  Alert, oriented and cooperative. Patient is in no acute distress.  Skin: Skin is warm and dry. No rash noted.   Cardiovascular: Normal heart rate noted  Respiratory: Normal respiratory effort, no problems with respiration noted  Abdomen: Soft, gravid, appropriate for gestational age.  Pain/Pressure: Absent     Pelvic: Cervical exam deferred        Extremities: Normal range of motion.  Edema: None  Mental Status: Normal mood and affect. Normal behavior. Normal judgment and thought content.   Assessment and Plan:  Pregnancy: G3P1011 at [redacted]w[redacted]d 1. Supervision of high risk pregnancy, antepartum Patient is doing well without complaints Third trimester labs and glucola next visit Reviewed NIPS results  2. Chronic hypertension affecting pregnancy Stable continue labetalol and ASA Follow up growth ultrasound as scheduled  3. Status post cesarean delivery Information on  VBAC vs RCS provided Will discuss delivery plan at next visit  4. Obesity affecting pregnancy in second trimester   Preterm labor symptoms and general obstetric precautions including but not limited to vaginal bleeding, contractions, leaking of fluid and fetal movement were reviewed in detail with the patient. Please refer to After Visit Summary for other counseling recommendations.   Return in about 2 weeks (around 04/16/2021) for in person, ROB, High risk, 2 hr glucola next visit.  Future Appointments  Date Time Provider [place]  04/02/2021 11:10 AM Myshawn Chiriboga, Vickii Chafe, MD [facility]  04/11/2021 10:00 AM [facility] NURSE [facility]  04/11/2021 10:15 AM [facility] US2 [facility]    Mora Bellman, MD

## 2021-04-11 ENCOUNTER — Ambulatory Visit: Payer: Medicaid Other

## 2021-04-12 ENCOUNTER — Ambulatory Visit: Payer: Medicaid Other | Attending: Maternal & Fetal Medicine

## 2021-04-12 ENCOUNTER — Ambulatory Visit: Payer: Medicaid Other | Admitting: *Deleted

## 2021-04-12 ENCOUNTER — Other Ambulatory Visit: Payer: Self-pay | Admitting: *Deleted

## 2021-04-12 ENCOUNTER — Other Ambulatory Visit: Payer: Self-pay

## 2021-04-12 ENCOUNTER — Encounter: Payer: Self-pay | Admitting: *Deleted

## 2021-04-12 VITALS — BP 128/60 | HR 82

## 2021-04-12 DIAGNOSIS — Z3689 Encounter for other specified antenatal screening: Secondary | ICD-10-CM | POA: Insufficient documentation

## 2021-04-12 DIAGNOSIS — O10013 Pre-existing essential hypertension complicating pregnancy, third trimester: Secondary | ICD-10-CM | POA: Diagnosis not present

## 2021-04-12 DIAGNOSIS — O09299 Supervision of pregnancy with other poor reproductive or obstetric history, unspecified trimester: Secondary | ICD-10-CM

## 2021-04-12 DIAGNOSIS — O10912 Unspecified pre-existing hypertension complicating pregnancy, second trimester: Secondary | ICD-10-CM | POA: Insufficient documentation

## 2021-04-12 DIAGNOSIS — O34219 Maternal care for unspecified type scar from previous cesarean delivery: Secondary | ICD-10-CM | POA: Insufficient documentation

## 2021-04-12 DIAGNOSIS — Z3A28 28 weeks gestation of pregnancy: Secondary | ICD-10-CM | POA: Diagnosis not present

## 2021-04-12 DIAGNOSIS — O99212 Obesity complicating pregnancy, second trimester: Secondary | ICD-10-CM

## 2021-04-12 DIAGNOSIS — O10919 Unspecified pre-existing hypertension complicating pregnancy, unspecified trimester: Secondary | ICD-10-CM

## 2021-04-12 DIAGNOSIS — R638 Other symptoms and signs concerning food and fluid intake: Secondary | ICD-10-CM | POA: Insufficient documentation

## 2021-04-12 DIAGNOSIS — Z6839 Body mass index (BMI) 39.0-39.9, adult: Secondary | ICD-10-CM

## 2021-04-16 ENCOUNTER — Other Ambulatory Visit: Payer: Self-pay

## 2021-04-16 ENCOUNTER — Ambulatory Visit (INDEPENDENT_AMBULATORY_CARE_PROVIDER_SITE_OTHER): Payer: Medicaid Other | Admitting: Obstetrics and Gynecology

## 2021-04-16 VITALS — BP 124/75 | HR 90 | Wt 236.0 lb

## 2021-04-16 DIAGNOSIS — O0992 Supervision of high risk pregnancy, unspecified, second trimester: Secondary | ICD-10-CM

## 2021-04-16 DIAGNOSIS — O0990 Supervision of high risk pregnancy, unspecified, unspecified trimester: Secondary | ICD-10-CM

## 2021-04-16 DIAGNOSIS — Z3A28 28 weeks gestation of pregnancy: Secondary | ICD-10-CM

## 2021-04-16 NOTE — Progress Notes (Signed)
? ?  PRENATAL VISIT NOTE ? ?Subjective:  ?Sara Morse is a 26 y.o. G3P1011 at [redacted]w[redacted]d being seen today for ongoing prenatal care.  She is currently monitored for the following issues for this high-risk pregnancy and has Anxiety and depression; Supervision of high risk pregnancy, antepartum; Chronic hypertension affecting pregnancy; Status post cesarean delivery; Obesity affecting pregnancy; and History of macrosomia in infant in prior pregnancy, currently pregnant on their problem list. ? ?Patient reports no complaints.  Contractions: Not present. Vag. Bleeding: None.  Movement: Present. Denies leaking of fluid.  ? ?The following portions of the patient's history were reviewed and updated as appropriate: allergies, current medications, past family history, past medical history, past social history, past surgical history and problem list.  ? ?Objective:  ? ?Vitals:  ? 04/16/21 0913  ?BP: 124/75  ?Pulse: 90  ?Weight: 236 lb (107 kg)  ? ? ?Fetal Status: Fetal Heart Rate (bpm): 159   Movement: Present    ? ?General:  Alert, oriented and cooperative. Patient is in no acute distress.  ?Skin: Skin is warm and dry. No rash noted.   ?Cardiovascular: Normal heart rate noted  ?Respiratory: Normal respiratory effort, no problems with respiration noted  ?Abdomen: Soft, gravid, appropriate for gestational age.  Pain/Pressure: Absent     ?Pelvic: Cervical exam deferred        ?Extremities: Normal range of motion.  Edema: None  ?Mental Status: Normal mood and affect. Normal behavior. Normal judgment and thought content.  ? ?Assessment and Plan:  ? ?1. Supervision of high risk pregnancy, antepartum ? ?- 2Hr GTT w/ 1 Hr Carpenter 75 g ?- HIV antibody (with reflex) ?- CBC ?- RPR  ?- Continue labetalol 200 mg BID- BP looks good. ?- Continue BASA ?- Weekly BPP's @ 32 weeks with MFM ?- Reviewed signs and symptoms of Pre E.  ?- Pap not done at new OB, needs in 3rd trimester or PP  ?- Desires TDAP next visit. ? ?Preterm labor symptoms and  general obstetric precautions including but not limited to vaginal bleeding, contractions, leaking of fluid and fetal movement were reviewed in detail with the patient. ?Please refer to After Visit Summary for other counseling recommendations.  ? ?No follow-ups on file. ? ?Future Appointments  ?Date Time Provider [place]  ?04/30/2021  9:10 AM Millard Bautch, Artist Pais, NP [facility]  ?05/13/2021  9:45 AM [facility] NURSE [facility]  ?05/13/2021 10:00 AM [facility] US1 [facility]  ?05/27/2021  9:45 AM [facility] NURSE [facility]  ?05/27/2021 10:00 AM [facility] US1 [facility]  ? ? ?Noni Saupe, NP  ?

## 2021-04-16 NOTE — Progress Notes (Signed)
Pt will get Tdap at next visit 

## 2021-04-17 LAB — CBC
HCT: 35 % (ref 35.0–45.0)
Hemoglobin: 11.5 g/dL — ABNORMAL LOW (ref 11.7–15.5)
MCH: 29.1 pg (ref 27.0–33.0)
MCHC: 32.9 g/dL (ref 32.0–36.0)
MCV: 88.6 fL (ref 80.0–100.0)
MPV: 9.8 fL (ref 7.5–12.5)
Platelets: 303 10*3/uL (ref 140–400)
RBC: 3.95 10*6/uL (ref 3.80–5.10)
RDW: 12.5 % (ref 11.0–15.0)
WBC: 9.6 10*3/uL (ref 3.8–10.8)

## 2021-04-17 LAB — 2HR GTT W 1 HR, CARPENTER, 75 G
Glucose, 1 Hr, Gest: 127 mg/dL (ref 65–179)
Glucose, 2 Hr, Gest: 114 mg/dL (ref 65–152)
Glucose, Fasting, Gest: 76 mg/dL (ref 65–91)

## 2021-04-17 LAB — RPR: RPR Ser Ql: NONREACTIVE

## 2021-04-17 LAB — HIV ANTIBODY (ROUTINE TESTING W REFLEX): HIV 1&2 Ab, 4th Generation: NONREACTIVE

## 2021-04-30 ENCOUNTER — Telehealth (INDEPENDENT_AMBULATORY_CARE_PROVIDER_SITE_OTHER): Payer: Medicaid Other | Admitting: Obstetrics and Gynecology

## 2021-04-30 DIAGNOSIS — Z3A30 30 weeks gestation of pregnancy: Secondary | ICD-10-CM

## 2021-04-30 DIAGNOSIS — O0993 Supervision of high risk pregnancy, unspecified, third trimester: Secondary | ICD-10-CM | POA: Diagnosis not present

## 2021-04-30 DIAGNOSIS — O99212 Obesity complicating pregnancy, second trimester: Secondary | ICD-10-CM

## 2021-04-30 DIAGNOSIS — O09299 Supervision of pregnancy with other poor reproductive or obstetric history, unspecified trimester: Secondary | ICD-10-CM

## 2021-04-30 DIAGNOSIS — O09293 Supervision of pregnancy with other poor reproductive or obstetric history, third trimester: Secondary | ICD-10-CM | POA: Diagnosis not present

## 2021-04-30 DIAGNOSIS — O0990 Supervision of high risk pregnancy, unspecified, unspecified trimester: Secondary | ICD-10-CM

## 2021-04-30 DIAGNOSIS — O99213 Obesity complicating pregnancy, third trimester: Secondary | ICD-10-CM | POA: Diagnosis not present

## 2021-04-30 MED ORDER — ONDANSETRON HCL 8 MG PO TABS: 8.0000 mg | ORAL_TABLET | Freq: Three times a day (TID) | ORAL | 0 refills | Status: DC | PRN

## 2021-04-30 NOTE — Progress Notes (Signed)
? ? ?  TELEHEALTH OBSTETRICS VISIT ENCOUNTER NOTE ? ?Provider location: [facility] at [place]  ? ?Patient location: Home ? ?I connected with Sara Morse on 04/30/21 at  9:10 AM EDT by telephone at home and verified that I am speaking with the correct person using two identifiers.  ?  ?I discussed the limitations, risks, security and privacy concerns of performing an evaluation and management service by telephone and the availability of in person appointments. I also discussed with the patient that there may be a patient responsible charge related to this service. The patient expressed understanding and agreed to proceed. ? ?Subjective:  ?Sara Morse is a 26 y.o. G3P1011 at [redacted]w[redacted]d being followed for ongoing prenatal care.  She is currently monitored for the following issues for this high-risk pregnancy and has Anxiety and depression; Supervision of high risk pregnancy, antepartum; Chronic hypertension affecting pregnancy; Status post cesarean delivery; Obesity affecting pregnancy; and History of macrosomia in infant in prior pregnancy, currently pregnant on their problem list. ? ?Patient reports  patient converted in person visit to virtual visit d/t stomach bug.  . Reports fetal movement. Denies any contractions, bleeding or leaking of fluid.  ? ?The following portions of the patient's history were reviewed and updated as appropriate: allergies, current medications, past family history, past medical history, past social history, past surgical history and problem list.  ? ?Objective:  ?Last menstrual period 09/27/2020. ?General:  Alert, oriented and cooperative.   ?Mental Status: Normal mood and affect perceived. Normal judgment and thought content.  ?Rest of physical exam deferred due to type of encounter ? ?Assessment and Plan:  ?Pregnancy: G3P1011 at [redacted]w[redacted]d ? ?1. Obesity affecting pregnancy in second trimester ? ? ?2. History of macrosomia in infant in prior pregnancy, currently  pregnant ? ?- MFM visits scheduled ? ?3. Supervision of high risk pregnancy, antepartum ? ?Virtual visit today d/t stomach bug.  ?2 hour GTT normal.  ?TDAP next visit ?Pap @ 36 weeks, no pap documented- patient unsure of date of last.  ?BP 130/74, continue labetalol 200 mg BID ?Continue BASA.  ? ?Term labor symptoms and general obstetric precautions including but not limited to vaginal bleeding, contractions, leaking of fluid and fetal movement were reviewed in detail with the patient.  ?I discussed the assessment and treatment plan with the patient. The patient was provided an opportunity to ask questions and all were answered. The patient agreed with the plan and demonstrated an understanding of the instructions. The patient was advised to call back or seek an in-person office evaluation/go to MAU at [facility] for any urgent or concerning symptoms. ?Please refer to After Visit Summary for other counseling recommendations.  ? ?I provided 10 minutes of non-face-to-face time during this encounter. ? ?Return in about 2 weeks (around 05/14/2021), or MD visit to discuss RC/S vs TOLAC. ? ?Future Appointments  ?Date Time Provider [place]  ?05/13/2021  9:45 AM [facility] NURSE [facility]  ?05/13/2021 10:00 AM [facility] US1 [facility]  ?05/27/2021  9:45 AM [facility] NURSE [facility]  ?05/27/2021 10:00 AM [facility] US1 [facility]  ? ? ?Noni Saupe, NP ?[facility] [place] ? ?  ?

## 2021-04-30 NOTE — Progress Notes (Signed)
Pt switched appt to virtual due to having the stomach bug ?

## 2021-05-02 ENCOUNTER — Other Ambulatory Visit: Payer: Self-pay | Admitting: *Deleted

## 2021-05-02 DIAGNOSIS — O10919 Unspecified pre-existing hypertension complicating pregnancy, unspecified trimester: Secondary | ICD-10-CM

## 2021-05-02 MED ORDER — LABETALOL HCL 200 MG PO TABS: 200.0000 mg | ORAL_TABLET | Freq: Two times a day (BID) | ORAL | 3 refills | Status: DC

## 2021-05-02 NOTE — Telephone Encounter (Signed)
RF for Labetalol sent to pharmacy per VO J Rasch,NP ?

## 2021-05-09 NOTE — Progress Notes (Signed)
? ?OBSTETRICS PRENATAL VIRTUAL VISIT ENCOUNTER NOTE ? ?Provider location: [facility] at [place]  ? ?Patient location: Home ? ?I connected with [name] on 05/13/21 at  1:30 PM EDT by MyChart Video Encounter and verified that I am speaking with the correct person using two identifiers. I discussed the limitations, risks, security and privacy concerns of performing an evaluation and management service virtually and the availability of in person appointments. I also discussed with the patient that there may be a patient responsible charge related to this service. The patient expressed understanding and agreed to proceed. ?Subjective:  ?Sara Morse is a 26 y.o. G3P1011 at [redacted]w[redacted]d being seen today for ongoing prenatal care.  She is currently monitored for the following issues for this high-risk pregnancy and has Anxiety and depression; Supervision of high risk pregnancy, antepartum; Chronic hypertension affecting pregnancy; Status post cesarean delivery; Obesity affecting pregnancy; and History of macrosomia in infant in prior pregnancy, currently pregnant on their problem list. ? ?Patient reports no complaints.  Contractions: Not present. Vag. Bleeding: None.  Movement: Present. Denies any leaking of fluid.  ? ?The following portions of the patient's history were reviewed and updated as appropriate: allergies, current medications, past family history, past medical history, past social history, past surgical history and problem list.  ? ?Objective:  ? ?Vitals:  ? 05/13/21 1328  ?BP: 138/62  ?Pulse: 91  ? ? ?Fetal Status:     Movement: Present    ? ?General:  Alert, oriented and cooperative. Patient is in no acute distress.  ?Respiratory: Normal respiratory effort, no problems with respiration noted  ?Mental Status: Normal mood and affect. Normal behavior. Normal judgment and thought content.  ?Rest of physical exam deferred due to type of encounter ? ?Imaging: ?[place] MFM FETAL BPP WO NON  STRESS ? ?Result Date: 05/13/2021 ?----------------------------------------------------------------------  OBSTETRICS REPORT                       (Signed Final 05/13/2021 11:07 am) ---------------------------------------------------------------------- Patient Info  ID #:       [id number]                          D.O.B.:  08/19/1995 (26 yrs)  Name:       Sara Morse                  Visit Date: 05/13/2021 10:19 am ---------------------------------------------------------------------- Performed By  Attending:        Ma Rings MD         Ref. Address:     [address], [place]  Performed By:     Eden Lathe BS      [facility]  Referred By:      Sara Morse All ---------------------------------------------------------------------- Orders  #  Description                           Code        Ordered By  1  [place] MFM OB FOLLOW UP                   [phone]    RAVI SHANKAR  2  [place] MFM FETAL BPP WO NON               76819.01    RAVI [facility]     STRESS ----------------------------------------------------------------------  #  Order #                     Accession #                Episode #  1  [id number]                   [id number]                 [id number]  2  [id number]                   [id number]                 [id number] ---------------------------------------------------------------------- Indications  Hypertension - Chronic/Pre-existing            O10.019  (Labetalol)  Obesity complicating pregnancy, third          O99.213  trimester  [redacted] weeks gestation of pregnancy                Z3A.32  History of cesarean delivery, currently        O34.219  pregnant  Poor obstetric history: Prior fetal            O09.299   macrosomia, antepartum ---------------------------------------------------------------------- Fetal Evaluation  Num Of Fetuses:         1  Fetal Heart Rate(bpm):  166  Cardiac Activity:       Observed  Presentation:           Cephalic  Placenta:               Anterior  P. Cord Insertion:      Previously Visualized  Amniotic Fluid  AFI FV:      Within normal limits  AFI Sum(cm)     %Tile       Largest Pocket(cm)  13              40          4.3  RUQ(cm)       RLQ(cm)       LUQ(cm)        LLQ(cm)  2.7           4.3           3.4            2.6 ---------------------------------------------------------------------- Biophysical Evaluation  Amniotic F.V:  Within normal limits       F. Tone:        Observed  F. Movement:    Observed                   Score:          8/8  F. Breathing:   Observed ---------------------------------------------------------------------- Biometry  BPD:      86.6  mm     G. Age:  35w 0d         95  %    CI:        75.23   %    70 - 86                                                          FL/HC:      17.4   %    19.9 - 21.5  HC:      316.7  mm     G. Age:  35w 4d         87  %    HC/AC:      1.09        0.96 - 1.11  AC:      289.8  mm     G. Age:  33w 0d         89  %    FL/BPD:     63.5   %    71 - 87  FL:         55  mm     G. Age:  29w 0d        < 1  %    FL/AC:      19.0   %    20 - 24  HUM:      53.6  mm     G. Age:  31w 1d         28  %  LV:        4.7  mm  Est. FW:    1931  gm      4 lb 4 oz     30  % ---------------------------------------------------------------------- OB History  Blood Type:   O+  Gravidity:    3         Term:   1        Prem:   0        SAB:   0  TOP:          0       Ectopic:  0        Living: 1 ---------------------------------------------------------------------- Gestational Age  LMP:           32w 4d        Date:  09/27/20                 EDD:   07/04/21  U/S Today:     33w 1d                                        EDD:   06/30/21  Best:          32w 4d     Det.  By:  LMP  (09/27/20)          EDD:   07/04/21 ---------------------------------------------------------------------- Anatomy  Cranium:               Appears normal         Aortic Arch:            Appears normal  Cavum:                 Appears normal         Ductal Arch:            Previously seen  Ventricles:            Appears normal         Diaphragm:              Previously seen  Choroid Plexus:        Previously seen        Stomach:                Appears normal, left                                                                        sided  Cerebellum:            Previously seen        Abdomen:                Previously seen  Posterior Fossa:       Previously seen        Abdominal Wall:         Previously seen  Nuchal Fold:           Previously seen        Cord Vessels:           Previously seen  Face:                  Appears normal         Kidneys:                Appear normal                         (orbits and profile)  Lips:                  Appears normal         Bladder:                Appears normal  Thoracic:              Previously seen        Spine:                  Previously seen  Heart:                 Previously seen        Upper Extremities:      Previously seen  RVOT:                  Previously seen        Lower Extremities:      Previously seen  LVOT:  Previously seen  Other:  Nasal bone visualized. Technically difficult due to maternal habitus          and fetal position. ---------------------------------------------------------------------- Cervix Uterus Adnexa  Cervix  Not visualized (advanced GA >24wks)  Uterus  No abnormality visualized.  Right Ovary  Not visualized.  Left Ovary  Not visualized.  Cul De Sac  No free fluid seen.  Adnexa  No abnormality visualized. ---------------------------------------------------------------------- Comments  This patient was seen for a follow up growth scan and BPP  due to maternal obesity with a BMI of 40 and chronic  hypertension  treated with labetalol 200 mg twice a day.  She  denies any problems since her last exam.  She was informed that the fetal growth and amniotic fluid  level appears appropriate for her gestational age.  A B

## 2021-05-13 ENCOUNTER — Encounter: Payer: Self-pay | Admitting: Obstetrics and Gynecology

## 2021-05-13 ENCOUNTER — Other Ambulatory Visit: Payer: Self-pay | Admitting: *Deleted

## 2021-05-13 ENCOUNTER — Telehealth: Payer: Self-pay | Admitting: *Deleted

## 2021-05-13 ENCOUNTER — Telehealth (INDEPENDENT_AMBULATORY_CARE_PROVIDER_SITE_OTHER): Payer: Medicaid Other | Admitting: Obstetrics and Gynecology

## 2021-05-13 ENCOUNTER — Ambulatory Visit: Payer: Medicaid Other | Attending: Obstetrics and Gynecology

## 2021-05-13 ENCOUNTER — Encounter: Payer: Self-pay | Admitting: *Deleted

## 2021-05-13 ENCOUNTER — Ambulatory Visit: Payer: Medicaid Other | Admitting: *Deleted

## 2021-05-13 VITALS — BP 138/62 | HR 91

## 2021-05-13 DIAGNOSIS — Z98891 History of uterine scar from previous surgery: Secondary | ICD-10-CM

## 2021-05-13 DIAGNOSIS — O99213 Obesity complicating pregnancy, third trimester: Secondary | ICD-10-CM

## 2021-05-13 DIAGNOSIS — O10013 Pre-existing essential hypertension complicating pregnancy, third trimester: Secondary | ICD-10-CM

## 2021-05-13 DIAGNOSIS — F419 Anxiety disorder, unspecified: Secondary | ICD-10-CM

## 2021-05-13 DIAGNOSIS — O34219 Maternal care for unspecified type scar from previous cesarean delivery: Secondary | ICD-10-CM

## 2021-05-13 DIAGNOSIS — O09299 Supervision of pregnancy with other poor reproductive or obstetric history, unspecified trimester: Secondary | ICD-10-CM

## 2021-05-13 DIAGNOSIS — O09293 Supervision of pregnancy with other poor reproductive or obstetric history, third trimester: Secondary | ICD-10-CM | POA: Diagnosis not present

## 2021-05-13 DIAGNOSIS — O10913 Unspecified pre-existing hypertension complicating pregnancy, third trimester: Secondary | ICD-10-CM

## 2021-05-13 DIAGNOSIS — O10919 Unspecified pre-existing hypertension complicating pregnancy, unspecified trimester: Secondary | ICD-10-CM | POA: Insufficient documentation

## 2021-05-13 DIAGNOSIS — F32A Depression, unspecified: Secondary | ICD-10-CM

## 2021-05-13 DIAGNOSIS — Z3A32 32 weeks gestation of pregnancy: Secondary | ICD-10-CM

## 2021-05-13 DIAGNOSIS — O0990 Supervision of high risk pregnancy, unspecified, unspecified trimester: Secondary | ICD-10-CM

## 2021-05-13 DIAGNOSIS — O99343 Other mental disorders complicating pregnancy, third trimester: Secondary | ICD-10-CM

## 2021-05-13 DIAGNOSIS — Z3A34 34 weeks gestation of pregnancy: Secondary | ICD-10-CM

## 2021-05-13 DIAGNOSIS — O99212 Obesity complicating pregnancy, second trimester: Secondary | ICD-10-CM

## 2021-05-13 DIAGNOSIS — E669 Obesity, unspecified: Secondary | ICD-10-CM

## 2021-05-13 DIAGNOSIS — O0993 Supervision of high risk pregnancy, unspecified, third trimester: Secondary | ICD-10-CM

## 2021-05-13 DIAGNOSIS — Z6839 Body mass index (BMI) 39.0-39.9, adult: Secondary | ICD-10-CM | POA: Insufficient documentation

## 2021-05-13 NOTE — Telephone Encounter (Signed)
Left patient an urgent message with scheduled appointment information for 05/27/2021 at 1:30 with an 1:15 PM arrival time. Patient advised to call or MyChart message the office if she needs to change or reschedule appointment. ?

## 2021-05-17 ENCOUNTER — Encounter: Payer: Self-pay | Admitting: *Deleted

## 2021-05-20 ENCOUNTER — Ambulatory Visit: Payer: Medicaid Other | Attending: Obstetrics

## 2021-05-20 ENCOUNTER — Other Ambulatory Visit: Payer: Medicaid Other

## 2021-05-20 ENCOUNTER — Ambulatory Visit: Payer: Medicaid Other | Admitting: *Deleted

## 2021-05-20 VITALS — BP 131/71

## 2021-05-20 DIAGNOSIS — E669 Obesity, unspecified: Secondary | ICD-10-CM

## 2021-05-20 DIAGNOSIS — O10013 Pre-existing essential hypertension complicating pregnancy, third trimester: Secondary | ICD-10-CM | POA: Diagnosis not present

## 2021-05-20 DIAGNOSIS — Z3A33 33 weeks gestation of pregnancy: Secondary | ICD-10-CM

## 2021-05-20 DIAGNOSIS — O10913 Unspecified pre-existing hypertension complicating pregnancy, third trimester: Secondary | ICD-10-CM | POA: Diagnosis present

## 2021-05-20 DIAGNOSIS — O99213 Obesity complicating pregnancy, third trimester: Secondary | ICD-10-CM

## 2021-05-20 DIAGNOSIS — O34219 Maternal care for unspecified type scar from previous cesarean delivery: Secondary | ICD-10-CM | POA: Diagnosis not present

## 2021-05-20 DIAGNOSIS — O09293 Supervision of pregnancy with other poor reproductive or obstetric history, third trimester: Secondary | ICD-10-CM

## 2021-05-20 DIAGNOSIS — O09299 Supervision of pregnancy with other poor reproductive or obstetric history, unspecified trimester: Secondary | ICD-10-CM | POA: Insufficient documentation

## 2021-05-27 ENCOUNTER — Telehealth: Payer: Self-pay

## 2021-05-27 ENCOUNTER — Encounter: Payer: Self-pay | Admitting: *Deleted

## 2021-05-27 ENCOUNTER — Encounter: Payer: Self-pay | Admitting: Obstetrics & Gynecology

## 2021-05-27 ENCOUNTER — Ambulatory Visit: Payer: Medicaid Other | Admitting: *Deleted

## 2021-05-27 ENCOUNTER — Ambulatory Visit: Payer: Medicaid Other | Attending: Obstetrics and Gynecology

## 2021-05-27 ENCOUNTER — Encounter: Payer: Medicaid Other | Admitting: Obstetrics & Gynecology

## 2021-05-27 VITALS — BP 128/61 | HR 83

## 2021-05-27 DIAGNOSIS — O99213 Obesity complicating pregnancy, third trimester: Secondary | ICD-10-CM

## 2021-05-27 DIAGNOSIS — O09293 Supervision of pregnancy with other poor reproductive or obstetric history, third trimester: Secondary | ICD-10-CM

## 2021-05-27 DIAGNOSIS — O10013 Pre-existing essential hypertension complicating pregnancy, third trimester: Secondary | ICD-10-CM

## 2021-05-27 DIAGNOSIS — O09299 Supervision of pregnancy with other poor reproductive or obstetric history, unspecified trimester: Secondary | ICD-10-CM | POA: Diagnosis present

## 2021-05-27 DIAGNOSIS — Z3A34 34 weeks gestation of pregnancy: Secondary | ICD-10-CM

## 2021-05-27 DIAGNOSIS — O10919 Unspecified pre-existing hypertension complicating pregnancy, unspecified trimester: Secondary | ICD-10-CM | POA: Insufficient documentation

## 2021-05-27 DIAGNOSIS — E669 Obesity, unspecified: Secondary | ICD-10-CM

## 2021-05-27 DIAGNOSIS — Z6839 Body mass index (BMI) 39.0-39.9, adult: Secondary | ICD-10-CM | POA: Diagnosis present

## 2021-05-27 DIAGNOSIS — O34219 Maternal care for unspecified type scar from previous cesarean delivery: Secondary | ICD-10-CM | POA: Diagnosis not present

## 2021-05-27 NOTE — Telephone Encounter (Signed)
Attempted to call pt to begin virtual visit. Pt did not answer. 

## 2021-05-28 ENCOUNTER — Telehealth (INDEPENDENT_AMBULATORY_CARE_PROVIDER_SITE_OTHER): Payer: Medicaid Other | Admitting: Obstetrics and Gynecology

## 2021-05-28 VITALS — BP 135/70

## 2021-05-28 DIAGNOSIS — Z3A34 34 weeks gestation of pregnancy: Secondary | ICD-10-CM

## 2021-05-28 DIAGNOSIS — O10913 Unspecified pre-existing hypertension complicating pregnancy, third trimester: Secondary | ICD-10-CM

## 2021-05-28 DIAGNOSIS — Z98891 History of uterine scar from previous surgery: Secondary | ICD-10-CM

## 2021-05-28 DIAGNOSIS — O0993 Supervision of high risk pregnancy, unspecified, third trimester: Secondary | ICD-10-CM

## 2021-05-28 DIAGNOSIS — O0990 Supervision of high risk pregnancy, unspecified, unspecified trimester: Secondary | ICD-10-CM

## 2021-05-28 DIAGNOSIS — O09299 Supervision of pregnancy with other poor reproductive or obstetric history, unspecified trimester: Secondary | ICD-10-CM

## 2021-05-28 DIAGNOSIS — O09293 Supervision of pregnancy with other poor reproductive or obstetric history, third trimester: Secondary | ICD-10-CM

## 2021-05-28 DIAGNOSIS — O10919 Unspecified pre-existing hypertension complicating pregnancy, unspecified trimester: Secondary | ICD-10-CM

## 2021-05-28 DIAGNOSIS — O34219 Maternal care for unspecified type scar from previous cesarean delivery: Secondary | ICD-10-CM

## 2021-05-28 DIAGNOSIS — O99213 Obesity complicating pregnancy, third trimester: Secondary | ICD-10-CM

## 2021-05-28 MED ORDER — ASPIRIN EC 81 MG PO TBEC: 81.0000 mg | DELAYED_RELEASE_TABLET | Freq: Every day | ORAL | 7 refills | Status: DC

## 2021-05-28 NOTE — Progress Notes (Signed)
? ? ?  TELEHEALTH OBSTETRICS VISIT ENCOUNTER NOTE ? ?Provider location: [facility] at [place]  ? ?Patient location: Home ? ?I connected with Sara Morse on 05/28/21 at  1:50 PM EDT by video at home and verified that I am speaking with the correct person using two identifiers.  ?I discussed the limitations, risks, security and privacy concerns of performing an evaluation and management service by telephone and the availability of in person appointments. I also discussed with the patient that there may be a patient responsible charge related to this service. The patient expressed understanding and agreed to proceed. ? ?Subjective:  ?Sara Morse is a 26 y.o. G3P1011 at [redacted]w[redacted]d being followed for ongoing prenatal care.  She is currently monitored for the following issues for this high-risk pregnancy and has Anxiety and depression; Supervision of high risk pregnancy, antepartum; Chronic hypertension affecting pregnancy; Status post cesarean delivery; Obesity affecting pregnancy; and History of macrosomia in infant in prior pregnancy, currently pregnant on their problem list. ? ?Patient reports no complaints. Reports fetal movement. Denies any contractions, bleeding or leaking of fluid.  ? ?The following portions of the patient's history were reviewed and updated as appropriate: allergies, current medications, past family history, past medical history, past social history, past surgical history and problem list.  ? ?Objective:  ?Blood pressure 135/70, last menstrual period 09/27/2020. ?General:  Alert, oriented and cooperative.   ?Mental Status: Normal mood and affect perceived. Normal judgment and thought content.  ?Rest of physical exam deferred due to type of encounter ? ?Assessment and Plan:  ?Pregnancy: G3P1011 at [redacted]w[redacted]d ? ?1. Obesity affecting pregnancy in third trimester ? ?- Continue BASA - instructed to stop at 36 weeks.  ? ?2. History of macrosomia in infant in prior pregnancy,  currently pregnant ? ?-[place] on 5/9 ? ?3. Supervision of high risk pregnancy, antepartum ? ?- BP 135/70 today.  ?- continue labetalol 200 mg BID  ?- Cultures and PAP @ 36 week.  ? ?4. Status post cesarean delivery ? ?- Scheduled for repeat C/s on 5/27 ? ?Preterm labor symptoms and general obstetric precautions including but not limited to vaginal bleeding, contractions, leaking of fluid and fetal movement were reviewed in detail with the patient.  ?I discussed the assessment and treatment plan with the patient. The patient was provided an opportunity to ask questions and all were answered. The patient agreed with the plan and demonstrated an understanding of the instructions. The patient was advised to call back or seek an in-person office evaluation/go to MAU at [facility] for any urgent or concerning symptoms. ?Please refer to After Visit Summary for other counseling recommendations.  ? ?I provided 10 minutes of non-face-to-face time during this encounter. ? ?No follow-ups on file. ? ?Future Appointments  ?Date Time Provider [place]  ?06/03/2021 10:15 AM [facility] NST [facility]  ?06/11/2021  1:15 PM [facility] NURSE [facility]  ?06/11/2021  1:30 PM [facility] US2 [facility]  ?06/14/2021 10:30 AM Julianne Handler, CNM [facility]  ?06/20/2021  1:15 PM [facility] NST [facility] [place]  ?06/27/2021  1:15 PM [facility] NST [facility] [place]  ? ? ?Noni Saupe, NP ?[facility] [place] ? ?  ?

## 2021-05-30 ENCOUNTER — Other Ambulatory Visit: Payer: Self-pay | Admitting: Certified Nurse Midwife

## 2021-05-30 DIAGNOSIS — F32A Depression, unspecified: Secondary | ICD-10-CM

## 2021-06-03 ENCOUNTER — Other Ambulatory Visit: Payer: Self-pay | Admitting: *Deleted

## 2021-06-03 ENCOUNTER — Other Ambulatory Visit: Payer: Medicaid Other

## 2021-06-03 ENCOUNTER — Encounter: Payer: Self-pay | Admitting: Maternal & Fetal Medicine

## 2021-06-03 ENCOUNTER — Telehealth: Payer: Self-pay | Admitting: *Deleted

## 2021-06-03 DIAGNOSIS — F419 Anxiety disorder, unspecified: Secondary | ICD-10-CM

## 2021-06-03 MED ORDER — SERTRALINE HCL 100 MG PO TABS: 100.0000 mg | ORAL_TABLET | Freq: Every day | ORAL | 2 refills | Status: AC

## 2021-06-03 NOTE — Telephone Encounter (Signed)
Returned call from 8:23 AM. Patient is needing a refill on Zoloft. Office fax machine is not working due to needing a supply since 05/30/2021. Waiting on supply to arrive from [place], [place]. ?

## 2021-06-03 NOTE — Progress Notes (Signed)
This encounter was created in error - please disregard.

## 2021-06-03 NOTE — Telephone Encounter (Cosign Needed)
Pt called requesting a RF on her Zoft.  RF sent ?

## 2021-06-11 ENCOUNTER — Ambulatory Visit: Payer: Medicaid Other

## 2021-06-11 ENCOUNTER — Ambulatory Visit: Payer: Medicaid Other | Attending: Obstetrics

## 2021-06-12 ENCOUNTER — Encounter (INDEPENDENT_AMBULATORY_CARE_PROVIDER_SITE_OTHER): Payer: Self-pay | Admitting: *Deleted

## 2021-06-14 ENCOUNTER — Other Ambulatory Visit (HOSPITAL_COMMUNITY)
Admission: RE | Admit: 2021-06-14 | Discharge: 2021-06-14 | Disposition: A | Discharge status: Home / Self Care | Payer: Medicaid Other | Source: Ambulatory Visit | Attending: Certified Nurse Midwife | Admitting: Certified Nurse Midwife

## 2021-06-14 ENCOUNTER — Ambulatory Visit (INDEPENDENT_AMBULATORY_CARE_PROVIDER_SITE_OTHER): Payer: Medicaid Other | Admitting: Certified Nurse Midwife

## 2021-06-14 VITALS — BP 122/77 | HR 89 | Wt 236.0 lb

## 2021-06-14 DIAGNOSIS — O0990 Supervision of high risk pregnancy, unspecified, unspecified trimester: Secondary | ICD-10-CM | POA: Diagnosis present

## 2021-06-14 DIAGNOSIS — Z3A37 37 weeks gestation of pregnancy: Secondary | ICD-10-CM

## 2021-06-14 DIAGNOSIS — O10919 Unspecified pre-existing hypertension complicating pregnancy, unspecified trimester: Secondary | ICD-10-CM

## 2021-06-14 DIAGNOSIS — Z98891 History of uterine scar from previous surgery: Secondary | ICD-10-CM

## 2021-06-14 LAB — OB RESULTS CONSOLE GBS
GBS: NEGATIVE
GBS: NEGATIVE
GBS: NEGATIVE
GBS: NEGATIVE
GBS: NEGATIVE

## 2021-06-14 NOTE — Progress Notes (Signed)
Subjective:  ?Sara Morse is a 26 y.o. G3P1011 at [redacted]w[redacted]d being seen today for ongoing prenatal care.  She is currently monitored for the following issues for this high-risk pregnancy and has Anxiety and depression; Supervision of high risk pregnancy, antepartum; Chronic hypertension affecting pregnancy; Status post cesarean delivery; Obesity affecting pregnancy; and History of macrosomia in infant in prior pregnancy, currently pregnant on their problem list. ? ?Patient reports no complaints.  Contractions: Not present. Vag. Bleeding: None.  Movement: Present. Denies leaking of fluid.  ? ?The following portions of the patient's history were reviewed and updated as appropriate: allergies, current medications, past family history, past medical history, past social history, past surgical history and problem list. Problem list updated. ? ?Objective:  ? ?Vitals:  ? 06/14/21 1023  ?BP: 122/77  ?Pulse: 89  ?Weight: 236 lb (107 kg)  ? ? ?Fetal Status: Fetal Heart Rate (bpm): 145 Fundal Height: 39 cm Movement: Present  Presentation: Vertex ? ?General:  Alert, oriented and cooperative. Patient is in no acute distress.  ?Skin: Skin is warm and dry. No rash noted.   ?Cardiovascular: Normal heart rate noted  ?Respiratory: Normal respiratory effort, no problems with respiration noted  ?Abdomen: Soft, gravid, appropriate for gestational age. Pain/Pressure: Present     ?Pelvic: Vag. Bleeding: None Vag D/C Character: Thin   ?Cervical exam deferred        ?Extremities: Normal range of motion.  Edema: None  ?Mental Status: Normal mood and affect. Normal behavior. Normal judgment and thought content.  ? ?Urinalysis:     ? ?Assessment and Plan:  ?Pregnancy: G3P1011 at [redacted]w[redacted]d ? ?1. Supervision of high risk pregnancy, antepartum ?- Cervicovaginal ancillary only( [place]) ?- Culture, beta strep (group b only) ? ?2. Status post cesarean delivery ?- rpt scheduled 5/27 ? ?3. Chronic hypertension affecting pregnancy ?- stable on Labetalol  200 bid ?- NST reactive, AFI 18cm; missed last 2 BPPs and f/u US at [facility], will reschedule ? ?4. [redacted] weeks gestation of pregnancy ? ? ?Term labor symptoms and general obstetric precautions including but not limited to vaginal bleeding, contractions, leaking of fluid and fetal movement were reviewed in detail with the patient. ?Please refer to After Visit Summary for other counseling recommendations.  ?Return in about 1 week (around 06/21/2021). ? ? ?Julianne Handler, CNM ? ?

## 2021-06-17 ENCOUNTER — Encounter: Payer: Self-pay | Admitting: *Deleted

## 2021-06-17 ENCOUNTER — Ambulatory Visit (HOSPITAL_BASED_OUTPATIENT_CLINIC_OR_DEPARTMENT_OTHER): Payer: Medicaid Other

## 2021-06-17 ENCOUNTER — Ambulatory Visit: Payer: Medicaid Other | Attending: Obstetrics and Gynecology | Admitting: *Deleted

## 2021-06-17 ENCOUNTER — Telehealth (HOSPITAL_COMMUNITY): Payer: Self-pay | Admitting: *Deleted

## 2021-06-17 ENCOUNTER — Other Ambulatory Visit: Payer: Self-pay | Admitting: *Deleted

## 2021-06-17 ENCOUNTER — Encounter (HOSPITAL_COMMUNITY): Payer: Self-pay

## 2021-06-17 VITALS — BP 123/60 | HR 90

## 2021-06-17 DIAGNOSIS — O10913 Unspecified pre-existing hypertension complicating pregnancy, third trimester: Secondary | ICD-10-CM | POA: Diagnosis present

## 2021-06-17 DIAGNOSIS — O09293 Supervision of pregnancy with other poor reproductive or obstetric history, third trimester: Secondary | ICD-10-CM | POA: Insufficient documentation

## 2021-06-17 DIAGNOSIS — O10013 Pre-existing essential hypertension complicating pregnancy, third trimester: Secondary | ICD-10-CM

## 2021-06-17 DIAGNOSIS — O3663X0 Maternal care for excessive fetal growth, third trimester, not applicable or unspecified: Secondary | ICD-10-CM | POA: Diagnosis not present

## 2021-06-17 DIAGNOSIS — O99213 Obesity complicating pregnancy, third trimester: Secondary | ICD-10-CM | POA: Diagnosis not present

## 2021-06-17 DIAGNOSIS — E669 Obesity, unspecified: Secondary | ICD-10-CM

## 2021-06-17 DIAGNOSIS — O09299 Supervision of pregnancy with other poor reproductive or obstetric history, unspecified trimester: Secondary | ICD-10-CM

## 2021-06-17 DIAGNOSIS — Z3A37 37 weeks gestation of pregnancy: Secondary | ICD-10-CM

## 2021-06-17 DIAGNOSIS — O34219 Maternal care for unspecified type scar from previous cesarean delivery: Secondary | ICD-10-CM

## 2021-06-17 LAB — CULTURE, BETA STREP (GROUP B ONLY)
MICRO NUMBER:: 13389074
SPECIMEN QUALITY:: ADEQUATE

## 2021-06-17 LAB — CERVICOVAGINAL ANCILLARY ONLY
Chlamydia: NEGATIVE
Comment: NEGATIVE
Comment: NORMAL
Neisseria Gonorrhea: NEGATIVE

## 2021-06-17 NOTE — Telephone Encounter (Signed)
Preadmission screen  

## 2021-06-18 ENCOUNTER — Encounter (HOSPITAL_COMMUNITY): Payer: Self-pay

## 2021-06-18 ENCOUNTER — Telehealth (HOSPITAL_COMMUNITY): Payer: Self-pay | Admitting: *Deleted

## 2021-06-18 NOTE — Telephone Encounter (Signed)
Preadmission screen  

## 2021-06-20 ENCOUNTER — Other Ambulatory Visit: Payer: Medicaid Other

## 2021-06-21 ENCOUNTER — Telehealth (INDEPENDENT_AMBULATORY_CARE_PROVIDER_SITE_OTHER): Payer: Medicaid Other | Admitting: Obstetrics and Gynecology

## 2021-06-21 ENCOUNTER — Encounter: Payer: Self-pay | Admitting: Obstetrics and Gynecology

## 2021-06-21 VITALS — BP 111/73

## 2021-06-21 DIAGNOSIS — O09299 Supervision of pregnancy with other poor reproductive or obstetric history, unspecified trimester: Secondary | ICD-10-CM

## 2021-06-21 DIAGNOSIS — O09293 Supervision of pregnancy with other poor reproductive or obstetric history, third trimester: Secondary | ICD-10-CM

## 2021-06-21 DIAGNOSIS — Z3A38 38 weeks gestation of pregnancy: Secondary | ICD-10-CM

## 2021-06-21 DIAGNOSIS — O0990 Supervision of high risk pregnancy, unspecified, unspecified trimester: Secondary | ICD-10-CM

## 2021-06-21 DIAGNOSIS — O10913 Unspecified pre-existing hypertension complicating pregnancy, third trimester: Secondary | ICD-10-CM

## 2021-06-21 DIAGNOSIS — O99213 Obesity complicating pregnancy, third trimester: Secondary | ICD-10-CM

## 2021-06-21 DIAGNOSIS — O10919 Unspecified pre-existing hypertension complicating pregnancy, unspecified trimester: Secondary | ICD-10-CM

## 2021-06-21 NOTE — Progress Notes (Signed)
    TELEHEALTH OBSTETRICS VISIT ENCOUNTER NOTE  Provider location: [facility] [place]   Patient location: Home  I connected with Sara Morse on 06/21/21 at 10:30 AM EDT by video at home and verified that I am speaking with the correct person using two identifiers.    I discussed the limitations, risks, security and privacy concerns of performing an evaluation and management service by telephone and the availability of in person appointments. I also discussed with the patient that there may be a patient responsible charge related to this service. The patient expressed understanding and agreed to proceed.  Subjective:  Sara Morse is a 26 y.o. G3P1011 at [redacted]w[redacted]d being followed for ongoing prenatal care.  She is currently monitored for the following issues for this high-risk pregnancy and has Anxiety and depression; Supervision of high risk pregnancy, antepartum; Chronic hypertension affecting pregnancy; Status post cesarean delivery; Obesity affecting pregnancy; and History of macrosomia in infant in prior pregnancy, currently pregnant on their problem list.  Patient reports no complaints. Reports fetal movement. Denies any contractions, bleeding or leaking of fluid.   The following portions of the patient's history were reviewed and updated as appropriate: allergies, current medications, past family history, past medical history, past social history, past surgical history and problem list.   Objective:  Blood pressure 111/73, last menstrual period 09/27/2020. General:  Alert, oriented and cooperative.   Mental Status: Normal mood and affect perceived. Normal judgment and thought content.  Rest of physical exam deferred due to type of encounter  Assessment and Plan:   1. Obesity affecting pregnancy in third trimester  Will stop BASA [place] scheduled 5/27 BPP on 5/15 was 8/8 Baby is moving great.    2. History of macrosomia in infant in prior pregnancy,  currently pregnant  Repeat US   3. Chronic hypertension affecting pregnancy  BP great Growth [place] on 5/22 with MFM  GBS negative    Term labor symptoms and general obstetric precautions including but not limited to vaginal bleeding, contractions, leaking of fluid and fetal movement were reviewed in detail with the patient.  I discussed the assessment and treatment plan with the patient. The patient was provided an opportunity to ask questions and all were answered. The patient agreed with the plan and demonstrated an understanding of the instructions. The patient was advised to call back or seek an in-person office evaluation/go to [facility]U at [facility] for any urgent or concerning symptoms. Please refer to After Visit Summary for other counseling recommendations.   I provided 10 minutes of non-face-to-face time during this encounter.  No follow-ups on file.  Future Appointments  Date Time Provider [place]  06/24/2021  8:30 AM [facility] NURSE [facility]  06/24/2021  8:45 AM [facility] US6 [facility]  06/27/2021  9:00 AM [facility] PAT 1 [facility] None    Noni Saupe, NP [facility], [place]

## 2021-06-24 ENCOUNTER — Encounter: Payer: Self-pay | Admitting: *Deleted

## 2021-06-24 ENCOUNTER — Ambulatory Visit: Payer: Medicaid Other | Attending: Obstetrics

## 2021-06-24 ENCOUNTER — Ambulatory Visit: Payer: Medicaid Other | Admitting: *Deleted

## 2021-06-24 VITALS — BP 130/69 | HR 86

## 2021-06-24 DIAGNOSIS — O10913 Unspecified pre-existing hypertension complicating pregnancy, third trimester: Secondary | ICD-10-CM | POA: Insufficient documentation

## 2021-06-24 DIAGNOSIS — O09293 Supervision of pregnancy with other poor reproductive or obstetric history, third trimester: Secondary | ICD-10-CM

## 2021-06-24 DIAGNOSIS — E669 Obesity, unspecified: Secondary | ICD-10-CM

## 2021-06-24 DIAGNOSIS — O09299 Supervision of pregnancy with other poor reproductive or obstetric history, unspecified trimester: Secondary | ICD-10-CM | POA: Insufficient documentation

## 2021-06-24 DIAGNOSIS — O34219 Maternal care for unspecified type scar from previous cesarean delivery: Secondary | ICD-10-CM | POA: Diagnosis not present

## 2021-06-24 DIAGNOSIS — O99213 Obesity complicating pregnancy, third trimester: Secondary | ICD-10-CM | POA: Diagnosis not present

## 2021-06-24 DIAGNOSIS — Z3A38 38 weeks gestation of pregnancy: Secondary | ICD-10-CM

## 2021-06-24 DIAGNOSIS — O10013 Pre-existing essential hypertension complicating pregnancy, third trimester: Secondary | ICD-10-CM

## 2021-06-27 ENCOUNTER — Telehealth: Payer: Self-pay

## 2021-06-27 ENCOUNTER — Encounter (HOSPITAL_COMMUNITY)
Admission: AD | Disposition: A | Discharge status: Home / Self Care | Payer: Self-pay | Source: Home / Self Care | Attending: Family Medicine

## 2021-06-27 ENCOUNTER — Encounter (HOSPITAL_COMMUNITY): Payer: Self-pay | Admitting: Obstetrics and Gynecology

## 2021-06-27 ENCOUNTER — Inpatient Hospital Stay (HOSPITAL_COMMUNITY): Admission: RE | Admit: 2021-06-27 | Payer: Medicaid Other | Source: Home / Self Care | Admitting: Family Medicine

## 2021-06-27 ENCOUNTER — Other Ambulatory Visit: Payer: Medicaid Other

## 2021-06-27 ENCOUNTER — Other Ambulatory Visit: Payer: Self-pay

## 2021-06-27 ENCOUNTER — Encounter (HOSPITAL_COMMUNITY)
Admission: RE | Admit: 2021-06-27 | Discharge: 2021-06-27 | Disposition: A | Discharge status: Home / Self Care | Payer: Medicaid Other | Source: Ambulatory Visit | Attending: Family Medicine | Admitting: Family Medicine

## 2021-06-27 ENCOUNTER — Inpatient Hospital Stay (HOSPITAL_COMMUNITY)
Admission: AD | Admit: 2021-06-27 | Discharge: 2021-06-29 | DRG: 787 | Disposition: A | Discharge status: Home / Self Care | Payer: Medicaid Other | Attending: Family Medicine | Admitting: Family Medicine

## 2021-06-27 ENCOUNTER — Inpatient Hospital Stay (HOSPITAL_COMMUNITY): Payer: Medicaid Other | Admitting: Anesthesiology

## 2021-06-27 DIAGNOSIS — O9081 Anemia of the puerperium: Secondary | ICD-10-CM | POA: Diagnosis not present

## 2021-06-27 DIAGNOSIS — O164 Unspecified maternal hypertension, complicating childbirth: Secondary | ICD-10-CM

## 2021-06-27 DIAGNOSIS — O34211 Maternal care for low transverse scar from previous cesarean delivery: Secondary | ICD-10-CM

## 2021-06-27 DIAGNOSIS — Z98891 History of uterine scar from previous surgery: Secondary | ICD-10-CM

## 2021-06-27 DIAGNOSIS — O26893 Other specified pregnancy related conditions, third trimester: Secondary | ICD-10-CM | POA: Diagnosis present

## 2021-06-27 DIAGNOSIS — O0990 Supervision of high risk pregnancy, unspecified, unspecified trimester: Secondary | ICD-10-CM

## 2021-06-27 DIAGNOSIS — F32A Depression, unspecified: Secondary | ICD-10-CM | POA: Diagnosis present

## 2021-06-27 DIAGNOSIS — O09299 Supervision of pregnancy with other poor reproductive or obstetric history, unspecified trimester: Secondary | ICD-10-CM

## 2021-06-27 DIAGNOSIS — O1002 Pre-existing essential hypertension complicating childbirth: Secondary | ICD-10-CM | POA: Diagnosis present

## 2021-06-27 DIAGNOSIS — O99344 Other mental disorders complicating childbirth: Secondary | ICD-10-CM | POA: Diagnosis present

## 2021-06-27 DIAGNOSIS — O99214 Obesity complicating childbirth: Secondary | ICD-10-CM | POA: Diagnosis present

## 2021-06-27 DIAGNOSIS — O99210 Obesity complicating pregnancy, unspecified trimester: Secondary | ICD-10-CM | POA: Diagnosis present

## 2021-06-27 DIAGNOSIS — F419 Anxiety disorder, unspecified: Secondary | ICD-10-CM | POA: Diagnosis present

## 2021-06-27 DIAGNOSIS — Z3A39 39 weeks gestation of pregnancy: Secondary | ICD-10-CM

## 2021-06-27 DIAGNOSIS — O4202 Full-term premature rupture of membranes, onset of labor within 24 hours of rupture: Secondary | ICD-10-CM | POA: Diagnosis not present

## 2021-06-27 DIAGNOSIS — D62 Acute posthemorrhagic anemia: Secondary | ICD-10-CM | POA: Diagnosis not present

## 2021-06-27 DIAGNOSIS — O1092 Unspecified pre-existing hypertension complicating childbirth: Secondary | ICD-10-CM | POA: Diagnosis not present

## 2021-06-27 DIAGNOSIS — Z01812 Encounter for preprocedural laboratory examination: Secondary | ICD-10-CM | POA: Insufficient documentation

## 2021-06-27 DIAGNOSIS — O10919 Unspecified pre-existing hypertension complicating pregnancy, unspecified trimester: Secondary | ICD-10-CM | POA: Diagnosis present

## 2021-06-27 DIAGNOSIS — O99213 Obesity complicating pregnancy, third trimester: Principal | ICD-10-CM

## 2021-06-27 LAB — CBC
HCT: 34.6 % — ABNORMAL LOW (ref 36.0–46.0)
Hemoglobin: 11 g/dL — ABNORMAL LOW (ref 12.0–15.0)
MCH: 26.6 pg (ref 26.0–34.0)
MCHC: 31.8 g/dL (ref 30.0–36.0)
MCV: 83.6 fL (ref 80.0–100.0)
Platelets: 286 10*3/uL (ref 150–400)
RBC: 4.14 MIL/uL (ref 3.87–5.11)
RDW: 13 % (ref 11.5–15.5)
WBC: 8.4 10*3/uL (ref 4.0–10.5)
nRBC: 0 % (ref 0.0–0.2)

## 2021-06-27 LAB — TYPE AND SCREEN
ABO/RH(D): O POS
Antibody Screen: NEGATIVE

## 2021-06-27 LAB — POCT FERN TEST: POCT Fern Test: POSITIVE

## 2021-06-27 LAB — RPR: RPR Ser Ql: NONREACTIVE

## 2021-06-27 SURGERY — Surgical Case
Anesthesia: Spinal

## 2021-06-27 MED ORDER — KETOROLAC TROMETHAMINE 30 MG/ML IJ SOLN
INTRAMUSCULAR | Status: DC | PRN
Start: 2021-06-27 — End: 2021-06-27
  Administered 2021-06-27: 30 mg via INTRAVENOUS

## 2021-06-27 MED ORDER — SODIUM CHLORIDE 0.9% FLUSH: 3.0000 mL | INTRAVENOUS | Status: DC | PRN

## 2021-06-27 MED ORDER — MEPERIDINE HCL 25 MG/ML IJ SOLN: 6.2500 mg | INTRAMUSCULAR | Status: DC | PRN

## 2021-06-27 MED ORDER — MENTHOL 3 MG MT LOZG: 1.0000 | LOZENGE | OROMUCOSAL | Status: DC | PRN

## 2021-06-27 MED ORDER — OXYTOCIN-SODIUM CHLORIDE 30-0.9 UT/500ML-% IV SOLN
INTRAVENOUS | Status: AC
  Filled 2021-06-27: qty 500

## 2021-06-27 MED ORDER — MISOPROSTOL 200 MCG PO TABS
ORAL_TABLET | ORAL | Status: AC
  Filled 2021-06-27: qty 5

## 2021-06-27 MED ORDER — MEPERIDINE HCL 25 MG/ML IJ SOLN
INTRAMUSCULAR | Status: DC | PRN
  Administered 2021-06-27: 12.5 mg via INTRAVENOUS

## 2021-06-27 MED ORDER — SCOPOLAMINE 1 MG/3DAYS TD PT72
1.0000 | MEDICATED_PATCH | Freq: Once | TRANSDERMAL | Status: DC
Start: 2021-06-27 — End: 2021-06-29

## 2021-06-27 MED ORDER — DIBUCAINE (PERIANAL) 1 % EX OINT
1.0000 | TOPICAL_OINTMENT | CUTANEOUS | Status: DC | PRN
Start: 2021-06-27 — End: 2021-06-29

## 2021-06-27 MED ORDER — SODIUM CHLORIDE 0.9 % IV SOLN
INTRAVENOUS | Status: AC
  Filled 2021-06-27: qty 5

## 2021-06-27 MED ORDER — KETOROLAC TROMETHAMINE 30 MG/ML IJ SOLN
30.0000 mg | Freq: Four times a day (QID) | INTRAMUSCULAR | Status: AC
  Administered 2021-06-27 – 2021-06-28 (×4): 30 mg via INTRAVENOUS
  Filled 2021-06-27 (×4): qty 1

## 2021-06-27 MED ORDER — ONDANSETRON HCL 4 MG/2ML IJ SOLN: 4.0000 mg | Freq: Once | INTRAMUSCULAR | Status: DC | PRN

## 2021-06-27 MED ORDER — SENNOSIDES-DOCUSATE SODIUM 8.6-50 MG PO TABS
2.0000 | ORAL_TABLET | Freq: Every day | ORAL | Status: DC
  Administered 2021-06-28 – 2021-06-29 (×2): 2 via ORAL
  Filled 2021-06-27 (×2): qty 2

## 2021-06-27 MED ORDER — ACETAMINOPHEN 500 MG PO TABS
1000.0000 mg | ORAL_TABLET | Freq: Four times a day (QID) | ORAL | Status: DC
  Administered 2021-06-27 – 2021-06-29 (×6): 1000 mg via ORAL
  Filled 2021-06-27 (×6): qty 2

## 2021-06-27 MED ORDER — LACTATED RINGERS IV SOLN: INTRAVENOUS | Status: DC

## 2021-06-27 MED ORDER — PHENYLEPHRINE 80 MCG/ML (10ML) SYRINGE FOR IV PUSH (FOR BLOOD PRESSURE SUPPORT)
PREFILLED_SYRINGE | INTRAVENOUS | Status: AC
  Filled 2021-06-27: qty 20

## 2021-06-27 MED ORDER — BUPIVACAINE IN DEXTROSE 0.75-8.25 % IT SOLN
INTRATHECAL | Status: DC | PRN
  Administered 2021-06-27: 1.6 mL via INTRATHECAL

## 2021-06-27 MED ORDER — NALOXONE HCL 4 MG/10ML IJ SOLN: 1.0000 ug/kg/h | INTRAVENOUS | Status: DC | PRN

## 2021-06-27 MED ORDER — ONDANSETRON HCL 4 MG/2ML IJ SOLN
INTRAMUSCULAR | Status: DC | PRN
  Administered 2021-06-27: 4 mg via INTRAVENOUS

## 2021-06-27 MED ORDER — DIPHENHYDRAMINE HCL 25 MG PO CAPS: 25.0000 mg | ORAL_CAPSULE | Freq: Four times a day (QID) | ORAL | Status: DC | PRN

## 2021-06-27 MED ORDER — MIDAZOLAM HCL 2 MG/2ML IJ SOLN
INTRAMUSCULAR | Status: AC
  Filled 2021-06-27: qty 2

## 2021-06-27 MED ORDER — FENTANYL CITRATE (PF) 100 MCG/2ML IJ SOLN
INTRAMUSCULAR | Status: AC
  Filled 2021-06-27: qty 2

## 2021-06-27 MED ORDER — HYDROMORPHONE HCL 1 MG/ML IJ SOLN: 0.2500 mg | INTRAMUSCULAR | Status: DC | PRN

## 2021-06-27 MED ORDER — PHENYLEPHRINE HCL-NACL 20-0.9 MG/250ML-% IV SOLN
INTRAVENOUS | Status: DC | PRN
  Administered 2021-06-27: 60 ug/min via INTRAVENOUS

## 2021-06-27 MED ORDER — ACETAMINOPHEN 10 MG/ML IV SOLN
INTRAVENOUS | Status: AC
  Filled 2021-06-27: qty 100

## 2021-06-27 MED ORDER — ONDANSETRON HCL 4 MG/2ML IJ SOLN
INTRAMUSCULAR | Status: AC
  Filled 2021-06-27: qty 2

## 2021-06-27 MED ORDER — ACETAMINOPHEN 500 MG PO TABS: 1000.0000 mg | ORAL_TABLET | Freq: Four times a day (QID) | ORAL | Status: DC

## 2021-06-27 MED ORDER — AMISULPRIDE (ANTIEMETIC) 5 MG/2ML IV SOLN: 10.0000 mg | Freq: Once | INTRAVENOUS | Status: DC | PRN

## 2021-06-27 MED ORDER — KETOROLAC TROMETHAMINE 30 MG/ML IJ SOLN: 30.0000 mg | Freq: Four times a day (QID) | INTRAMUSCULAR | Status: AC | PRN

## 2021-06-27 MED ORDER — SCOPOLAMINE 1 MG/3DAYS TD PT72
MEDICATED_PATCH | TRANSDERMAL | Status: DC | PRN
Start: 2021-06-27 — End: 2021-06-27
  Administered 2021-06-27: 1 via TRANSDERMAL

## 2021-06-27 MED ORDER — MORPHINE SULFATE (PF) 0.5 MG/ML IJ SOLN
INTRAMUSCULAR | Status: AC
  Filled 2021-06-27: qty 10

## 2021-06-27 MED ORDER — SIMETHICONE 80 MG PO CHEW: 80.0000 mg | CHEWABLE_TABLET | ORAL | Status: DC | PRN

## 2021-06-27 MED ORDER — OXYCODONE HCL 5 MG PO TABS
5.0000 mg | ORAL_TABLET | ORAL | Status: DC | PRN
  Administered 2021-06-28: 10 mg via ORAL
  Filled 2021-06-27: qty 2

## 2021-06-27 MED ORDER — KETOROLAC TROMETHAMINE 30 MG/ML IJ SOLN
INTRAMUSCULAR | Status: AC
  Filled 2021-06-27: qty 1

## 2021-06-27 MED ORDER — COCONUT OIL OIL: 1.0000 "application " | TOPICAL_OIL | Status: DC | PRN

## 2021-06-27 MED ORDER — PRENATAL MULTIVITAMIN CH
1.0000 | ORAL_TABLET | Freq: Every day | ORAL | Status: DC
  Filled 2021-06-27: qty 1

## 2021-06-27 MED ORDER — METOCLOPRAMIDE HCL 5 MG/ML IJ SOLN
INTRAMUSCULAR | Status: DC | PRN
  Administered 2021-06-27 (×2): 5 mg via INTRAVENOUS

## 2021-06-27 MED ORDER — MISOPROSTOL 200 MCG PO TABS: 1000.0000 ug | ORAL_TABLET | Freq: Once | ORAL | Status: DC

## 2021-06-27 MED ORDER — PHENYLEPHRINE HCL-NACL 20-0.9 MG/250ML-% IV SOLN
INTRAVENOUS | Status: AC
  Filled 2021-06-27: qty 250

## 2021-06-27 MED ORDER — MISOPROSTOL 25 MCG QUARTER TABLET
ORAL_TABLET | ORAL | Status: DC | PRN
  Administered 2021-06-27: 1000 ug via RECTAL

## 2021-06-27 MED ORDER — TRANEXAMIC ACID-NACL 1000-0.7 MG/100ML-% IV SOLN
INTRAVENOUS | Status: DC | PRN
  Administered 2021-06-27: 1000 mg via INTRAVENOUS

## 2021-06-27 MED ORDER — FENTANYL CITRATE (PF) 100 MCG/2ML IJ SOLN
INTRAMUSCULAR | Status: DC | PRN
  Administered 2021-06-27: 85 ug via INTRAVENOUS

## 2021-06-27 MED ORDER — GLYCOPYRROLATE PF 0.2 MG/ML IJ SOSY
PREFILLED_SYRINGE | INTRAMUSCULAR | Status: AC
  Filled 2021-06-27: qty 1

## 2021-06-27 MED ORDER — METOCLOPRAMIDE HCL 5 MG/ML IJ SOLN
INTRAMUSCULAR | Status: AC
  Filled 2021-06-27: qty 2

## 2021-06-27 MED ORDER — ACETAMINOPHEN 10 MG/ML IV SOLN
INTRAVENOUS | Status: DC | PRN
  Administered 2021-06-27: 1000 mg via INTRAVENOUS

## 2021-06-27 MED ORDER — FUROSEMIDE 20 MG PO TABS
20.0000 mg | ORAL_TABLET | Freq: Every day | ORAL | Status: DC
  Administered 2021-06-28 – 2021-06-29 (×2): 20 mg via ORAL
  Filled 2021-06-27 (×2): qty 1

## 2021-06-27 MED ORDER — KETOROLAC TROMETHAMINE 30 MG/ML IJ SOLN: 30.0000 mg | Freq: Once | INTRAMUSCULAR | Status: DC | PRN

## 2021-06-27 MED ORDER — TETANUS-DIPHTH-ACELL PERTUSSIS 5-2.5-18.5 LF-MCG/0.5 IM SUSY: 0.5000 mL | PREFILLED_SYRINGE | Freq: Once | INTRAMUSCULAR | Status: DC

## 2021-06-27 MED ORDER — MEPERIDINE HCL 25 MG/ML IJ SOLN
INTRAMUSCULAR | Status: AC
  Filled 2021-06-27: qty 1

## 2021-06-27 MED ORDER — DEXMEDETOMIDINE (PRECEDEX) IN NS 20 MCG/5ML (4 MCG/ML) IV SYRINGE
PREFILLED_SYRINGE | INTRAVENOUS | Status: DC | PRN
  Administered 2021-06-27: 12 ug via INTRAVENOUS
  Administered 2021-06-27: 8 ug via INTRAVENOUS

## 2021-06-27 MED ORDER — DIPHENHYDRAMINE HCL 50 MG/ML IJ SOLN: 12.5000 mg | INTRAMUSCULAR | Status: DC | PRN

## 2021-06-27 MED ORDER — PHENYLEPHRINE HCL (PRESSORS) 10 MG/ML IV SOLN
INTRAVENOUS | Status: DC | PRN
  Administered 2021-06-27: 160 ug via INTRAVENOUS

## 2021-06-27 MED ORDER — CEFAZOLIN SODIUM-DEXTROSE 2-3 GM-%(50ML) IV SOLR
INTRAVENOUS | Status: DC | PRN
  Administered 2021-06-27: 2 g via INTRAVENOUS

## 2021-06-27 MED ORDER — SOD CITRATE-CITRIC ACID 500-334 MG/5ML PO SOLN: 30.0000 mL | ORAL | Status: DC

## 2021-06-27 MED ORDER — SIMETHICONE 80 MG PO CHEW
80.0000 mg | CHEWABLE_TABLET | Freq: Three times a day (TID) | ORAL | Status: DC
  Administered 2021-06-28 – 2021-06-29 (×4): 80 mg via ORAL
  Filled 2021-06-27 (×4): qty 1

## 2021-06-27 MED ORDER — OXYCODONE HCL 5 MG PO TABS: 5.0000 mg | ORAL_TABLET | Freq: Once | ORAL | Status: DC | PRN

## 2021-06-27 MED ORDER — DEXMEDETOMIDINE (PRECEDEX) IN NS 20 MCG/5ML (4 MCG/ML) IV SYRINGE
PREFILLED_SYRINGE | INTRAVENOUS | Status: DC | PRN
  Administered 2021-06-27: 12 ug via INTRAVENOUS

## 2021-06-27 MED ORDER — NALOXONE HCL 0.4 MG/ML IJ SOLN: 0.4000 mg | INTRAMUSCULAR | Status: DC | PRN

## 2021-06-27 MED ORDER — ACETAMINOPHEN 500 MG PO TABS: 1000.0000 mg | ORAL_TABLET | ORAL | Status: DC

## 2021-06-27 MED ORDER — LACTATED RINGERS IV SOLN
INTRAVENOUS | Status: DC | PRN
Start: 2021-06-27 — End: 2021-06-27

## 2021-06-27 MED ORDER — TRANEXAMIC ACID-NACL 1000-0.7 MG/100ML-% IV SOLN
INTRAVENOUS | Status: AC
  Filled 2021-06-27: qty 100

## 2021-06-27 MED ORDER — FENTANYL CITRATE (PF) 100 MCG/2ML IJ SOLN
INTRAMUSCULAR | Status: DC | PRN
  Administered 2021-06-27: 15 ug via INTRATHECAL

## 2021-06-27 MED ORDER — OXYTOCIN-SODIUM CHLORIDE 30-0.9 UT/500ML-% IV SOLN
INTRAVENOUS | Status: DC | PRN
  Administered 2021-06-27: 450 mL via INTRAVENOUS

## 2021-06-27 MED ORDER — ONDANSETRON HCL 4 MG/2ML IJ SOLN: 4.0000 mg | Freq: Three times a day (TID) | INTRAMUSCULAR | Status: DC | PRN

## 2021-06-27 MED ORDER — DEXAMETHASONE SODIUM PHOSPHATE 4 MG/ML IJ SOLN
INTRAMUSCULAR | Status: AC
  Filled 2021-06-27: qty 2

## 2021-06-27 MED ORDER — IBUPROFEN 600 MG PO TABS
600.0000 mg | ORAL_TABLET | Freq: Four times a day (QID) | ORAL | Status: DC
  Administered 2021-06-29 (×2): 600 mg via ORAL
  Filled 2021-06-27 (×2): qty 1

## 2021-06-27 MED ORDER — LABETALOL HCL 200 MG PO TABS
200.0000 mg | ORAL_TABLET | Freq: Two times a day (BID) | ORAL | Status: DC
Start: 2021-06-27 — End: 2021-06-27

## 2021-06-27 MED ORDER — OXYCODONE HCL 5 MG/5ML PO SOLN: 5.0000 mg | Freq: Once | ORAL | Status: DC | PRN

## 2021-06-27 MED ORDER — SERTRALINE HCL 100 MG PO TABS
100.0000 mg | ORAL_TABLET | Freq: Every day | ORAL | Status: DC
  Administered 2021-06-28 – 2021-06-29 (×2): 100 mg via ORAL
  Filled 2021-06-27 (×2): qty 1

## 2021-06-27 MED ORDER — SODIUM CHLORIDE 0.9 % IV SOLN
500.0000 mg | Freq: Once | INTRAVENOUS | Status: AC
  Administered 2021-06-27: 500 mg via INTRAVENOUS
  Filled 2021-06-27: qty 5

## 2021-06-27 MED ORDER — DIPHENHYDRAMINE HCL 25 MG PO CAPS: 25.0000 mg | ORAL_CAPSULE | ORAL | Status: DC | PRN

## 2021-06-27 MED ORDER — PHENYLEPHRINE 80 MCG/ML (10ML) SYRINGE FOR IV PUSH (FOR BLOOD PRESSURE SUPPORT)
PREFILLED_SYRINGE | INTRAVENOUS | Status: AC
  Filled 2021-06-27: qty 10

## 2021-06-27 MED ORDER — CEFAZOLIN SODIUM-DEXTROSE 2-4 GM/100ML-% IV SOLN
2.0000 g | INTRAVENOUS | Status: DC
  Filled 2021-06-27: qty 100

## 2021-06-27 MED ORDER — POVIDONE-IODINE 10 % EX SWAB: 2.0000 "application " | Freq: Once | CUTANEOUS | Status: DC

## 2021-06-27 MED ORDER — OXYTOCIN-SODIUM CHLORIDE 30-0.9 UT/500ML-% IV SOLN
2.5000 [IU]/h | INTRAVENOUS | Status: AC
  Administered 2021-06-27: 2.5 [IU]/h via INTRAVENOUS
  Filled 2021-06-27: qty 500

## 2021-06-27 MED ORDER — DEXAMETHASONE SODIUM PHOSPHATE 4 MG/ML IJ SOLN
INTRAMUSCULAR | Status: DC | PRN
  Administered 2021-06-27: 8 mg via INTRAVENOUS

## 2021-06-27 MED ORDER — MORPHINE SULFATE (PF) 0.5 MG/ML IJ SOLN
INTRAMUSCULAR | Status: DC | PRN
  Administered 2021-06-27: 150 ug via INTRATHECAL

## 2021-06-27 MED ORDER — ENOXAPARIN SODIUM 60 MG/0.6ML IJ SOSY
50.0000 mg | PREFILLED_SYRINGE | INTRAMUSCULAR | Status: DC
  Administered 2021-06-28 – 2021-06-29 (×2): 50 mg via SUBCUTANEOUS
  Filled 2021-06-27 (×2): qty 0.6

## 2021-06-27 MED ORDER — WITCH HAZEL-GLYCERIN EX PADS: 1.0000 "application " | MEDICATED_PAD | CUTANEOUS | Status: DC | PRN

## 2021-06-27 MED ORDER — METHYLERGONOVINE MALEATE 0.2 MG/ML IJ SOLN
INTRAMUSCULAR | Status: DC | PRN
Start: 2021-06-27 — End: 2021-06-27
  Administered 2021-06-27: .2 mg via INTRAMUSCULAR

## 2021-06-27 MED ORDER — MIDAZOLAM HCL 2 MG/2ML IJ SOLN
INTRAMUSCULAR | Status: DC | PRN
  Administered 2021-06-27 (×2): 1 mg via INTRAVENOUS

## 2021-06-27 SURGICAL SUPPLY — 36 items
BENZOIN TINCTURE PRP APPL 2/3 (GAUZE/BANDAGES/DRESSINGS) ×2 IMPLANT
CHLORAPREP W/TINT 26 (MISCELLANEOUS) ×4 IMPLANT
CLAMP CORD UMBIL (MISCELLANEOUS) ×2 IMPLANT
CLOSURE STERI STRIP 1/2 X4 (GAUZE/BANDAGES/DRESSINGS) ×1 IMPLANT
CLOTH BEACON ORANGE TIMEOUT ST (SAFETY) ×2 IMPLANT
DRSG OPSITE POSTOP 4X10 (GAUZE/BANDAGES/DRESSINGS) ×2 IMPLANT
ELECT REM PT RETURN 9FT ADLT (ELECTROSURGICAL) ×2
ELECTRODE REM PT RTRN 9FT ADLT (ELECTROSURGICAL) ×1 IMPLANT
EXTRACTOR VACUUM M CUP 4 TUBE (SUCTIONS) IMPLANT
GLOVE BIOGEL PI IND STRL 7.0 (GLOVE) ×3 IMPLANT
GLOVE BIOGEL PI INDICATOR 7.0 (GLOVE) ×3
GLOVE ECLIPSE 7.0 STRL STRAW (GLOVE) ×2 IMPLANT
GOWN STRL REUS W/TWL LRG LVL3 (GOWN DISPOSABLE) ×4 IMPLANT
HEMOSTAT ARISTA ABSORB 3G PWDR (HEMOSTASIS) ×2 IMPLANT
KIT ABG SYR 3ML LUER SLIP (SYRINGE) ×2 IMPLANT
NDL HYPO 25X5/8 SAFETYGLIDE (NEEDLE) ×1 IMPLANT
NEEDLE HYPO 22GX1.5 SAFETY (NEEDLE) ×2 IMPLANT
NEEDLE HYPO 25X5/8 SAFETYGLIDE (NEEDLE) ×2 IMPLANT
NS IRRIG 1000ML POUR BTL (IV SOLUTION) ×2 IMPLANT
PACK C SECTION WH (CUSTOM PROCEDURE TRAY) ×2 IMPLANT
PAD ABD 7.5X8 STRL (GAUZE/BANDAGES/DRESSINGS) ×2 IMPLANT
PAD OB MATERNITY 4.3X12.25 (PERSONAL CARE ITEMS) ×2 IMPLANT
RTRCTR C-SECT PINK 25CM LRG (MISCELLANEOUS) ×2 IMPLANT
STRIP CLOSURE SKIN 1/2X4 (GAUZE/BANDAGES/DRESSINGS) ×2 IMPLANT
SUT MNCRL 0 VIOLET CTX 36 (SUTURE) ×2 IMPLANT
SUT MONOCRYL 0 CTX 36 (SUTURE) ×2
SUT VIC AB 0 CT1 27 (SUTURE) ×2
SUT VIC AB 0 CT1 27XBRD ANBCTR (SUTURE) IMPLANT
SUT VIC AB 0 CT1 36 (SUTURE) ×1 IMPLANT
SUT VIC AB 0 CTX 36 (SUTURE) ×1
SUT VIC AB 0 CTX36XBRD ANBCTRL (SUTURE) ×1 IMPLANT
SUT VIC AB 4-0 KS 27 (SUTURE) ×2 IMPLANT
SYR 30ML LL (SYRINGE) ×2 IMPLANT
TOWEL OR 17X24 6PK STRL BLUE (TOWEL DISPOSABLE) ×2 IMPLANT
TRAY FOLEY W/BAG SLVR 14FR LF (SET/KITS/TRAYS/PACK) ×2 IMPLANT
WATER STERILE IRR 1000ML POUR (IV SOLUTION) ×2 IMPLANT

## 2021-06-27 NOTE — Anesthesia Procedure Notes (Signed)
Spinal  Patient location during procedure: OR Start time: 06/27/2021 3:42 PM End time: 06/27/2021 3:44 PM Reason for block: surgical anesthesia Staffing Performed: anesthesiologist  Anesthesiologist: Lannie Fields, DO Preanesthetic Checklist Completed: patient identified, IV checked, risks and benefits discussed, surgical consent, monitors and equipment checked, pre-op evaluation and timeout performed Spinal Block Patient position: sitting Prep: DuraPrep and site prepped and draped Patient monitoring: cardiac monitor, continuous pulse ox and blood pressure Approach: midline Location: L3-4 Injection technique: single-shot Needle Needle type: Pencan  Needle gauge: 24 G Needle length: 9 cm Assessment Sensory level: T6 Events: CSF return Additional Notes Functioning IV was confirmed and monitors were applied. Sterile prep and drape, including hand hygiene and sterile gloves were used. The patient was positioned and the spine was prepped. The skin was anesthetized with lidocaine.  Free flow of clear CSF was obtained prior to injecting local anesthetic into the CSF.  The spinal needle aspirated freely following injection.  The needle was carefully withdrawn.  The patient tolerated the procedure well.

## 2021-06-27 NOTE — Lactation Note (Signed)
This note was copied from a baby's chart. Lactation Consultation Note Mom's feeding choice is formula feeding.  Patient Name: Sara Morse WGNFA'O Date: 06/27/2021   Age:28 hours  Maternal Data    Feeding    LATCH Score                    Lactation Tools Discussed/Used    Interventions    Discharge    Consult Status Consult Status: Complete    Charyl Dancer 06/27/2021, 9:26 PM

## 2021-06-27 NOTE — Anesthesia Postprocedure Evaluation (Signed)
Anesthesia Post Note  Patient: [place]  Procedure(s) Performed: CESAREAN SECTION     Patient location during evaluation: PACU Anesthesia Type: Spinal Level of consciousness: awake and alert Pain management: pain level controlled Vital Signs Assessment: post-procedure vital signs reviewed and stable Respiratory status: spontaneous breathing, nonlabored ventilation and respiratory function stable Cardiovascular status: blood pressure returned to baseline Postop Assessment: no apparent nausea or vomiting, spinal receding, no headache and no backache Anesthetic complications: no   No notable events documented.  Last Vitals:  Vitals:   06/27/21 1815 06/27/21 1830  BP: 123/65   Pulse:  (!) 104  Resp: 14 (!) 31  Temp:    SpO2:  100%    Last Pain:  Vitals:   06/27/21 1742  TempSrc: Oral  PainSc:    Pain Goal:                Epidural/Spinal Function Cutaneous sensation: Able to Discern Pressure (06/27/21 1815), Patient able to flex knees: Yes (06/27/21 1815), Patient able to lift hips off bed: No (06/27/21 1815), Back pain beyond tenderness at insertion site: No (06/27/21 1815), Progressively worsening motor and/or sensory loss: No (06/27/21 1815), Bowel and/or bladder incontinence post epidural: No (06/27/21 1815)  Shanda Howells

## 2021-06-27 NOTE — Anesthesia Preprocedure Evaluation (Signed)
Anesthesia Evaluation  Patient identified by MRN, date of birth, ID band Patient awake    Reviewed: Allergy & Precautions, Patient's Chart, lab work & pertinent test results, reviewed documented beta blocker date and time   Airway Mallampati: III  TM Distance: >3 FB Neck ROM: Full    Dental no notable dental hx.    Pulmonary neg pulmonary ROS,    Pulmonary exam normal breath sounds clear to auscultation       Cardiovascular hypertension, Pt. on medications and Pt. on home beta blockers Normal cardiovascular exam Rhythm:Regular Rate:Normal     Neuro/Psych PSYCHIATRIC DISORDERS Anxiety Depression negative neurological ROS     GI/Hepatic negative GI ROS, Neg liver ROS,   Endo/Other  Morbid obesityBMI 41  Renal/GU negative Renal ROS  negative genitourinary   Musculoskeletal negative musculoskeletal ROS (+)   Abdominal (+) + obese,   Peds negative pediatric ROS (+)  Hematology negative hematology ROS (+) Hb 11, plt 286   Anesthesia Other Findings   Reproductive/Obstetrics (+) Pregnancy Prior section 2020, presented to MAU ruptured and contracting                              Anesthesia Physical Anesthesia Plan  ASA: 3  Anesthesia Plan: Spinal   Post-op Pain Management: Regional block, Toradol IV (intra-op) and Tylenol PO (pre-op)   Induction:   PONV Risk Score and Plan: 3 and Ondansetron, Dexamethasone and Treatment may vary due to age or medical condition  Airway Management Planned: Natural Airway and Nasal Cannula  Additional Equipment: None  Intra-op Plan:   Post-operative Plan:   Informed Consent: I have reviewed the patients History and Physical, chart, labs and discussed the procedure including the risks, benefits and alternatives for the proposed anesthesia with the patient or authorized representative who has indicated his/her understanding and acceptance.       Plan  Discussed with: CRNA  Anesthesia Plan Comments:         Anesthesia Quick Evaluation

## 2021-06-27 NOTE — Discharge Summary (Signed)
Postpartum Discharge Summary  Date of Service updated     Patient Name: Sara Morse DOB: [birth year] MRN: [id number]  Date of admission: 06/27/2021 Delivery date:06/27/2021  Delivering provider: Caren Macadam  Date of discharge: 06/29/2021  Admitting diagnosis: Status post repeat low transverse cesarean section [Z98.891] Intrauterine pregnancy: [redacted]w[redacted]d    Secondary diagnosis:  Principal Problem:   Status post repeat low transverse cesarean section Active Problems:   Anxiety and depression   Supervision of high risk pregnancy, antepartum   Chronic hypertension affecting pregnancy   Obesity affecting pregnancy   History of macrosomia in infant in prior pregnancy, currently pregnant   Acute blood loss anemia  Additional problems: Acute blood loss anemia    Discharge diagnosis: Term Pregnancy Delivered                                              Post partum procedures:  none Augmentation: N/A Complications: None  Hospital course: Sceduled C/S - 26y.o. yo [id number] admitted to the hospital 06/27/2021 for scheduled cesarean section with the following indication: History of CS x 2, SROM.  Delivery details are as follows:  Membrane Rupture Time/Date: 11:00 AM ,06/27/2021   Delivery Method:C-Section, Low Transverse  Details of operation can be found in separate operative note.  Patient had an uncomplicated postpartum course.  Her hemoglobin on POD#1 was 9.7, for which she received PO iron.  Her BP remained normotensive postpartum.  She was started on Lasix, which she will continue upon discharge to complete a 5 day course.  She is ambulating, tolerating a regular diet, passing flatus, and urinating well.  Her pain and bleeding are controlled.  Patient is discharged home in stable condition on  06/29/21        Newborn Data: Birth date:06/27/2021  Birth time:4:22 PM  Gender:Female  Living status:Living  Apgars:8 ,9  Weight:3320 g     Magnesium Sulfate received:  No BMZ received: No Rhophylac: N/A MMR: N/A - Immune  T-DaP: Offered postpartum  Flu: Given prenatally  Transfusion: No  Physical exam  Vitals:   06/28/21 0930 06/28/21 1400 06/28/21 2123 06/29/21 0531  BP: 132/70 138/68 109/60 127/72  Pulse: 88 71 98 76  Resp: '16 16 20 18  ' Temp: 98.1 F (36.7 C) 98.4 F (36.9 C) 99 F (37.2 C) 97.6 F (36.4 C)  TempSrc: Oral Oral Oral Oral  SpO2: 99% 100% 100% 99%  Weight:      Height:       General: alert, cooperative, and no distress Lochia: appropriate Uterine Fundus: firm Incision: Healing well with no significant drainage, No significant erythema, Dressing is clean, dry, and intact DVT Evaluation: No evidence of DVT seen on physical exam.  Labs: Lab Results  Component Value Date   WBC 13.1 (H) 06/28/2021   HGB 9.7 (L) 06/28/2021   HCT 29.1 (L) 06/28/2021   MCV 82.4 06/28/2021   PLT 260 06/28/2021      Latest Ref Rng & Units 06/28/2021    6:16 AM  CMP  Creatinine 0.44 - 1.00 mg/dL 0.57     Edinburgh Score:    06/29/2021    5:33 AM  Edinburgh Postnatal Depression Scale Screening Tool  I have been able to laugh and see the funny side of things. 0  I have looked forward with enjoyment to things. 0  I have blamed myself unnecessarily when things went wrong. 1  I have been anxious or worried for no good reason. 1  I have felt scared or panicky for no good reason. 1  Things have been getting on top of me. 0  I have been so unhappy that I have had difficulty sleeping. 0  I have felt sad or miserable. 0  I have been so unhappy that I have been crying. 0  The thought of harming myself has occurred to me. 0  Edinburgh Postnatal Depression Scale Total 3     After visit meds:  Allergies as of 06/29/2021   No Known Allergies      Medication List     STOP taking these medications    aspirin EC 81 MG tablet   labetalol 200 MG tablet Commonly known as: NORMODYNE   ondansetron 8 MG tablet Commonly known as: Zofran        TAKE these medications    furosemide 20 MG tablet Commonly known as: LASIX Take 1 tablet (20 mg total) by mouth daily.   ibuprofen 600 MG tablet Commonly known as: ADVIL Take 1 tablet (600 mg total) by mouth every 6 (six) hours.   NIFEdipine 30 MG 24 hr tablet Commonly known as: ADALAT CC Take 1 tablet (30 mg total) by mouth daily.   oxyCODONE 5 MG immediate release tablet Commonly known as: Oxy IR/ROXICODONE Take 1 tablet (5 mg total) by mouth every 4 (four) hours as needed for up to 7 days for moderate pain.   PRENATAL VITAMIN PO Take 1 tablet by mouth daily.   sertraline 100 MG tablet Commonly known as: Zoloft Take 1 tablet (100 mg total) by mouth daily.         Discharge home in stable condition Infant Feeding: Bottle and Breast Infant Disposition: home with mother Discharge instruction: per After Visit Summary and Postpartum booklet. Activity: Advance as tolerated. Pelvic rest for 6 weeks.  Diet: routine diet Future Appointments: Future Appointments  Date Time Provider [place]  07/04/2021 10:00 AM [facility]  08/08/2021  9:50 AM Sloan Leiter, MD [facility]   Follow up Visit: Message sent to [facility] by Dr. Gwenlyn Perking on 06/27/21.   Please schedule this patient for a In person postpartum visit in 6 weeks with the following provider: Any provider. Additional Postpartum F/U: Incision check 1 week and BP check 1 week  High risk pregnancy complicated by: HTN and hx of CS x 2 Delivery mode:  C-Section, Low Transverse  Anticipated Birth Control:  POPs   06/29/2021 Renee Harder, CNM

## 2021-06-27 NOTE — Transfer of Care (Signed)
Immediate Anesthesia Transfer of Care Note  Patient: Sara Morse  Procedure(s) Performed: CESAREAN SECTION  Patient Location: PACU  Anesthesia Type:Spinal  Level of Consciousness: awake, alert  and oriented  Airway & Oxygen Therapy: Patient Spontanous Breathing  Post-op Assessment: Report given to RN and Post -op Vital signs reviewed and stable  Post vital signs: Reviewed and stable  Last Vitals:  Vitals Value Taken Time  BP 122/101 06/27/21 1742  Temp    Pulse 101 06/27/21 1746  Resp 17 06/27/21 1746  SpO2 98 % 06/27/21 1746  Vitals shown include unvalidated device data.  Last Pain:  Vitals:   06/27/21 1500  TempSrc:   PainSc: 0-No pain         Complications: No notable events documented.

## 2021-06-27 NOTE — MAU Note (Signed)
...  Sara Morse is a 26 y.o. at [redacted]w[redacted]d here in MAU reporting: Water broke around one hour ago. She reports it was clear fluid with no odor. Denies VB. +FM.  Repeat C/S. First C/S due to arrest of dilation per patient.  Pain score:  1/10 lower abdomen  FHT: 153 initial external Lab orders placed from triage:  MAU Labor

## 2021-06-27 NOTE — H&P (Signed)
LABOR AND DELIVERY ADMISSION HISTORY AND PHYSICAL NOTE  Sara Morse is a 26 y.o. female G3P1011 with IUP at 109w0d presenting for loss of fluid.   Around 1100 this morning she felt a gush of clear fluid and has continued to leak. No vaginal bleeding. She endorses normal fetal movement. Occasional light contractions, but they are not painful.   She plans on breast feeding. She requests pills for birth control.  Prenatal History/Complications: PNC at [facility]:  @[redacted]w[redacted]d , CWD, normal anatomy, cephalic presentation, anterior placenta, 55%ile, EFW 3201g  Pregnancy complications:  - cHTN on labetalol - anxiety/drepression - prior cesarean x1  Past Medical History: Past Medical History:  Diagnosis Date   Anxiety    Hypertension    Mental disorder     Past Surgical History: Past Surgical History:  Procedure Laterality Date   CESAREAN SECTION N/A 09/20/2018   Procedure: CESAREAN SECTION;  Surgeon: 09/22/2018, MD;  Location: [facility] LD ORS;  Service: Obstetrics;  Laterality: N/A;   DENTAL SURGERY      Obstetrical History: OB History     Gravida  3   Para  1   Term  1   Preterm      AB  1   Living  1      SAB      IAB      Ectopic      Multiple  0   Live Births  1           Social History: Social History   Socioeconomic History   Marital status: Single    Spouse name: Not on file   Number of children: 0   Years of education: Not on file   Highest education level: Not on file  Occupational History   Not on file  Tobacco Use   Smoking status: Never   Smokeless tobacco: Never  Vaping Use   Vaping Use: Never used  Substance and Sexual Activity   Alcohol use: Never   Drug use: Never   Sexual activity: Yes    Birth control/protection: None  Other Topics Concern   Not on file  Social History Narrative   Not on file   Social Determinants of Health   Financial Resource Strain: Not on file  Food Insecurity: Not on file   Transportation Needs: Not on file  Physical Activity: Not on file  Stress: Not on file  Social Connections: Not on file    Family History: Family History  Problem Relation Age of Onset   Cancer Paternal Grandfather    Breast cancer Paternal Grandmother    Other Maternal Grandmother        brain cancer   Cancer Maternal Grandfather     Allergies: No Known Allergies  Medications Prior to Admission  Medication Sig Dispense Refill Last Dose   aspirin EC 81 MG tablet Take 1 tablet (81 mg total) by mouth daily. Swallow whole. (Patient not taking: Reported on 06/24/2021) 30 tablet 7    labetalol (NORMODYNE) 200 MG tablet Take 1 tablet (200 mg total) by mouth 2 (two) times daily. 90 tablet 3    ondansetron (ZOFRAN) 8 MG tablet Take 1 tablet (8 mg total) by mouth every 8 (eight) hours as needed for nausea or vomiting. (Patient not taking: Reported on 06/25/2021) 20 tablet 0    Prenatal Vit-Fe Fumarate-FA (PRENATAL VITAMIN PO) Take 1 tablet by mouth daily.      sertraline (ZOLOFT) 100 MG tablet Take 1 tablet (100 mg  total) by mouth daily. 30 tablet 2      Review of Systems  All systems reviewed and negative except as stated in HPI  Physical Exam Pulse 96, temperature 99.7 F (37.6 C), temperature source Oral, resp. rate 17, height 5\' 4"  (1.626 m), weight 108.6 kg, last menstrual period 09/27/2020, SpO2 100 %. General appearance: alert, oriented, NAD Lungs: normal respiratory effort Heart: regular rate Abdomen: soft, non-tender; gravid Extremities: No calf swelling or tenderness  FHT Baseline 155 Variability moderate Accels present Decels absent Toco irregular contractions Category: I  Prenatal labs: ABO, Rh: --/--/O POS (05/25 0913) Antibody: NEG (05/25 0913) Rubella: 1.59 (11/01 1456) RPR: NON REACTIVE (05/25 0913)  HBsAg: NON-REACTIVE (11/01 1456)  HIV: NON-REACTIVE (03/14 0857)  GC/Chlamydia:  Neisseria Gonorrhea  Date Value Ref Range Status  06/14/2021 Negative   Final   Chlamydia  Date Value Ref Range Status  06/14/2021 Negative  Final    GBS:    2-hr GTT: normal Genetic screening:  declined Anatomy 08/14/2021: normal  Prenatal Transfer Tool  Maternal Diabetes: No Genetic Screening: Declined Maternal Ultrasounds/Referrals: Normal Fetal Ultrasounds or other Referrals:  None Maternal Substance Abuse:  No Significant Maternal Medications:  Meds include: Zoloft Other: Labetalol Significant Maternal Lab Results: Group B Strep negative  Results for orders placed or performed during the hospital encounter of 06/27/21 (from the past 24 hour(s))  Fern Test   Collection Time: 06/27/21 12:28 PM  Result Value Ref Range   POCT Fern Test Positive = ruptured amniotic membanes   Results for orders placed or performed during the hospital encounter of 06/27/21 (from the past 24 hour(s))  RPR   Collection Time: 06/27/21  9:13 AM  Result Value Ref Range   RPR Ser Ql NON REACTIVE NON REACTIVE  CBC   Collection Time: 06/27/21  9:13 AM  Result Value Ref Range   WBC 8.4 4.0 - 10.5 K/uL   RBC 4.14 3.87 - 5.11 MIL/uL   Hemoglobin 11.0 (L) 12.0 - 15.0 g/dL   HCT 06/29/21 (L) 09.2 - 33.0 %   MCV 83.6 80.0 - 100.0 fL   MCH 26.6 26.0 - 34.0 pg   MCHC 31.8 30.0 - 36.0 g/dL   RDW 07.6 22.6 - 33.3 %   Platelets 286 150 - 400 K/uL   nRBC 0.0 0.0 - 0.2 %  Type and screen [facility]   Collection Time: 06/27/21  9:13 AM  Result Value Ref Range   ABO/RH(D) O POS    Antibody Screen NEG    Sample Expiration      06/30/2021,2359 Performed at [facility], [address], [place], [place]     Patient Active Problem List   Diagnosis Date Noted   Obesity affecting pregnancy 03/01/2021   History of macrosomia in infant in prior pregnancy, currently pregnant 03/01/2021   Status post cesarean delivery 09/22/2018   Supervision of high risk pregnancy, antepartum 02/16/2018   Chronic hypertension affecting pregnancy 02/16/2018   Anxiety and  depression 05/27/2017    Assessment: Sara Morse is a 26 y.o. G3P1011 at [redacted]w[redacted]d here for scheduled CS.  #RCS:  #SROM: Fern positive. The risks of cesarean section discussed with the patient included but were not limited to: bleeding which may require transfusion or reoperation; infection which may require antibiotics; injury to bowel, bladder, ureters or other surrounding organs; injury to the fetus; need for additional procedures including hysterectomy in the event of a life-threatening hemorrhage; placental abnormalities with subsequent pregnancies, incisional  problems, thromboembolic phenomenon and other postoperative/anesthesia complications. The patient concurred with the proposed plan, giving informed written consent for the procedure. Patient has been NPO since last night she will remain NPO for procedure. Anesthesia and OR aware. Preoperative prophylactic antibiotics and SCDs ordered on call to the OR. To OR when ready.   #Anesthesia: Spinal #FWB: FHR Cat I #GBS/ID: negative #MOF: Breast #MOC: Pills, undecided on POPs vs OCPs #Circ: TBD  #cHTN: continue labetalol 200 mg BID  #Anxiety/depression: cont zoloft 100mg  daily  Venora MaplesMatthew M Mikhael Hendriks 06/27/2021, 1:02 PM

## 2021-06-27 NOTE — Op Note (Signed)
Sara Morse  PROCEDURE DATE: 06/27/2021  PREOPERATIVE DIAGNOSES: Intrauterine pregnancy at [redacted]w[redacted]d weeks gestation; history of cesarean section x 2; SROM  POSTOPERATIVE DIAGNOSES: The same  PROCEDURE: Repeat Low Transverse Cesarean Section  SURGEON:  Dr. Lyndel Safe  ASSISTANT:  Dr. Nettie Elm, Dr. Evalina Field  An experienced assistant was required given the standard of surgical care given the complexity of the case.  This assistant was needed for exposure, dissection, suctioning, retraction, instrument exchange, assisting with delivery with administration of fundal pressure, and for overall help during the procedure.  ANESTHESIOLOGY TEAM: Anesthesiologist: Lannie Fields, DO CRNA: Graciela Husbands, CRNA  INDICATIONS: Sara Morse is a 26 y.o. [phone] at [redacted]w[redacted]d here for cesarean section secondary to the indications listed under preoperative diagnoses; please see preoperative note for further details.  The risks of cesarean section were discussed with the patient including but were not limited to: bleeding which may require transfusion or reoperation; infection which may require antibiotics; injury to bowel, bladder, ureters or other surrounding organs; injury to the fetus; need for additional procedures including hysterectomy in the event of a life-threatening hemorrhage; placental abnormalities wth subsequent pregnancies, incisional problems, thromboembolic phenomenon and other postoperative/anesthesia complications.   The patient concurred with the proposed plan, giving informed written consent for the procedure.    FINDINGS:  Viable female infant in cephalic presentation.  Apgars 8 and 9.  Clear amniotic fluid.  Intact placenta, three vessel cord.  Normal uterus, fallopian tubes and ovaries bilaterally.  Minimal adhesive disease present.   ANESTHESIA: Spinal  INTRAVENOUS FLUIDS: 1900 ml   ESTIMATED BLOOD LOSS: 600 ml URINE OUTPUT:  300 ml SPECIMENS: Placenta sent to L&D   COMPLICATIONS: None immediate  PROCEDURE IN DETAIL:   The patient preoperatively received intravenous antibiotics and had sequential compression devices applied to her lower extremities.  She was then taken to the operating room where spinal anesthesia was administered and was found to be adequate. She was then placed in a dorsal supine position with a leftward tilt, and prepped and draped in a sterile manner.  A foley catheter was placed into her bladder and attached to constant gravity.    After an adequate timeout was performed, a Pfannenstiel skin incision was made with scalpel and carried through to the underlying layer of fascia. The fascia was incised in the midline, and this incision was extended bilaterally using the Mayo scissors.  Kocher clamps were applied to the superior aspect of the fascial incision and the underlying rectus muscles were dissected off bluntly and sharply.  The rectus muscles were separated in the midline and the peritoneum was entered bluntly. The Alexis self-retaining retractor was introduced into the abdominal cavity.    Attention was turned to the lower uterine segment where a low transverse hysterotomy was made with a scalpel and extended bilaterally bluntly.  The infant was successfully delivered, the cord was clamped and cut after one minute, and the infant was handed over to the awaiting neonatology team. Uterine massage was then administered, and the placenta delivered intact with a three-vessel cord. The uterus was then cleared of clots and debris.    The hysterotomy was closed with 0 Vicryl in a running locked fashion.  Figure-of-eight 0 Vicryl serosal stitches were placed to help with hemostasis.  There was a small right sided extension that continued to bleed despite 2-3 additional figure-of-eight stitches.  Dr. Alysia Penna called to OR for further evaluation.  Extension was then repaired with 0 Vicryl by Dr. Alysia Penna with good  result.  The pelvis was cleared of all clot  and debris.  Hemostasis was confirmed on all surfaces.  The retractor was removed.  Arista was applied.    The peritoneum and rectus muscles were re-approximated with 2-0 Vicryl. The fascia was then closed using 0 Vicryl in a running fashion.  The subcutaneous layer was irrigated, re-approximated with 2-0 plain gut interrupted stitches, and the skin was closed with a 4-0 Vicryl subcuticular stitch. The patient tolerated the procedure well. Sponge, instrument and needle counts were correct x 3.  She was taken to the recovery room in stable condition.   Evalina Field, MD  OB Fellow  Faculty [facility]

## 2021-06-27 NOTE — Telephone Encounter (Signed)
Pt states her water broke. Pt was told to go to the hospital. Pt expressed understanding.

## 2021-06-28 ENCOUNTER — Telehealth: Payer: Self-pay | Admitting: *Deleted

## 2021-06-28 ENCOUNTER — Encounter (HOSPITAL_COMMUNITY): Payer: Self-pay | Admitting: Family Medicine

## 2021-06-28 DIAGNOSIS — D62 Acute posthemorrhagic anemia: Secondary | ICD-10-CM | POA: Diagnosis not present

## 2021-06-28 LAB — CBC
HCT: 29.1 % — ABNORMAL LOW (ref 36.0–46.0)
Hemoglobin: 9.7 g/dL — ABNORMAL LOW (ref 12.0–15.0)
MCH: 27.5 pg (ref 26.0–34.0)
MCHC: 33.3 g/dL (ref 30.0–36.0)
MCV: 82.4 fL (ref 80.0–100.0)
Platelets: 260 10*3/uL (ref 150–400)
RBC: 3.53 MIL/uL — ABNORMAL LOW (ref 3.87–5.11)
RDW: 12.9 % (ref 11.5–15.5)
WBC: 13.1 10*3/uL — ABNORMAL HIGH (ref 4.0–10.5)
nRBC: 0 % (ref 0.0–0.2)

## 2021-06-28 LAB — CREATININE, SERUM
Creatinine, Ser: 0.57 mg/dL (ref 0.44–1.00)
GFR, Estimated: >60 mL/min (ref >60)

## 2021-06-28 MED ORDER — NIFEDIPINE ER OSMOTIC RELEASE 30 MG PO TB24
30.0000 mg | ORAL_TABLET | Freq: Every day | ORAL | Status: DC
  Administered 2021-06-28 – 2021-06-29 (×2): 30 mg via ORAL
  Filled 2021-06-28 (×2): qty 1

## 2021-06-28 NOTE — Telephone Encounter (Signed)
Left patient an urgent message to call the office as soon as possible to schedule 1 week BP and incision check. Also, 6 week postpartum.

## 2021-06-28 NOTE — Plan of Care (Signed)

## 2021-06-28 NOTE — Social Work (Signed)
[name] was referred for history of depression/anxiety.  * Referral screened out by Clinical Social Worker because none of the following criteria appear to apply: ~ History of anxiety/depression during this pregnancy, or of post-partum depression following prior delivery. ~ Diagnosis of anxiety and/or depression within last 3 years OR * [name]'s symptoms currently being treated with medication and/or therapy.Per chart review, [name] treats anxiety and depression with Zoloft 100mg , well controlled.   Please contact the Clinical Social Worker if needs arise, by [facility] request, or if [name] scores greater than 9/yes to question 10 on Edinburgh Postpartum Depression Screen.   Kathrin Greathouse, MSW, LCSW [facility] [place] Worker  [phone] 06/28/2021  9:43 AM

## 2021-06-28 NOTE — Progress Notes (Addendum)
POSTPARTUM PROGRESS NOTE  Post Operative Day 1  Subjective:  Sara Morse is a 26 y.o. [id number] s/p repeat c-section at [redacted]w[redacted]d.  No acute events overnight.  Patient denies problems with ambulating, voiding or PO intake.  She denies nausea or vomiting.  Pain is well controlled.  She has had flatus. She has not had bowel movement. Bleeding is a similar to a heavy period. No other concerns at this time.  Objective: Blood pressure 140/83, pulse 87, temperature 99.1 F (37.3 C), temperature source Oral, resp. rate 19, height 5\' 4"  (1.626 m), weight 108.6 kg, last menstrual period 09/27/2020, SpO2 100 %, unknown if currently breastfeeding.  Physical Exam:  General: alert, cooperative and no distress Resp: normal work of breathing on room air Heart: normal rate, warm and well perfused Abdomen: soft, nontender Uterine Fundus: firm and below umbilicus Extremities: No LE edema or calf tenderness to palpation Skin: warm, dry; incision clean/dry/intact  Recent Labs    06/27/21 0913 06/28/21 0616  HGB 11.0* 9.7*  HCT 34.6* 29.1*    Assessment/Plan: Sara Morse is a 26 y.o. [id number] s/p repeat low transverse cesarean section at [redacted]w[redacted]d   POD#1: Doing well. Hgb 11> 9.7. No symptoms of anemia. Meeting all milestones. VSS. Continue routine PP care.   cHTN: Start Lasix 20mg  and Procardia 30mg  daily. Will continue to adjust regimen as needed while inpatient.   Anxiety: Continue home Zoloft 100 mg daily. Mood stable.   Contraception: POPs  Circumcision: Yes, consented at bedside.   Feeding: Formula and breastfeeding  Dispo: Plan for discharge on POD2   LOS: 1 day   Leonette Nutting, Medical Student 06/28/2021, 8:50 AM   GME ATTESTATION:  I saw and evaluated the patient. I agree with the findings and the plan of care as documented in the student's note. I have personally performed the physical exam and coordinated management for this patient. I have made changes to documentation as  necessary.  Vilma Meckel, MD OB Fellow, [place] [facility] [place] 06/28/2021 1:56 PM

## 2021-06-29 DIAGNOSIS — Z98891 History of uterine scar from previous surgery: Secondary | ICD-10-CM

## 2021-06-29 MED ORDER — OXYCODONE HCL 5 MG PO TABS: 5.0000 mg | ORAL_TABLET | ORAL | 0 refills | Status: AC | PRN

## 2021-06-29 MED ORDER — NIFEDIPINE ER 30 MG PO TB24: 30.0000 mg | ORAL_TABLET | Freq: Every day | ORAL | 3 refills | Status: DC

## 2021-06-29 MED ORDER — FUROSEMIDE 20 MG PO TABS: 20.0000 mg | ORAL_TABLET | Freq: Every day | ORAL | 0 refills | Status: DC

## 2021-06-29 MED ORDER — IBUPROFEN 600 MG PO TABS: 600.0000 mg | ORAL_TABLET | Freq: Four times a day (QID) | ORAL | 0 refills | Status: DC

## 2021-07-04 ENCOUNTER — Ambulatory Visit: Payer: Medicaid Other

## 2021-07-04 ENCOUNTER — Encounter: Payer: Self-pay | Admitting: Certified Nurse Midwife

## 2021-07-05 ENCOUNTER — Ambulatory Visit (INDEPENDENT_AMBULATORY_CARE_PROVIDER_SITE_OTHER): Payer: Medicaid Other | Admitting: Obstetrics and Gynecology

## 2021-07-05 VITALS — BP 137/85 | HR 91 | Ht 64.0 in | Wt 222.0 lb

## 2021-07-05 DIAGNOSIS — Z0130 Encounter for examination of blood pressure without abnormal findings: Secondary | ICD-10-CM

## 2021-07-05 DIAGNOSIS — Z9889 Other specified postprocedural states: Secondary | ICD-10-CM | POA: Diagnosis not present

## 2021-07-05 NOTE — Progress Notes (Signed)
    07/05/2021    8:05 AM 06/29/2021   10:49 AM 06/29/2021    5:31 AM  Vitals with BMI  Height 5\' 4"     Weight 222 lbs    BMI 38.09    Systolic 137 133  Diastolic 85 78 72  Pulse 91 97 76     Ms.Sara Morse is a 26 y.o. female, status post repeat C/S on 5/25. She has a history of depression and cHTN. She is taking Nifedipine. She denies anxiety or depression symptoms.  GENERAL: Well-developed, well-nourished female in no acute distress.  LUNGS: Effort normal SKIN: Warm, dry and without erythema. Low transverse C/S incision clean, dry, intact. Steri strips removed. Area cleaned with betadine.  PSYCH: Normal mood and affect    1. BP check  BP great Continue BP medication   2. Postoperative state  Incision looks great Keep 4 week PP visit.    6/25 I, NP 07/05/2021 8:26 AM

## 2021-07-18 ENCOUNTER — Telehealth: Payer: Self-pay | Admitting: *Deleted

## 2021-07-18 NOTE — Telephone Encounter (Signed)
Returned call from 2:15 PM, office closed for MD meeting. Patient advised to return to MAU for assessment of incision. Office contacted MAU midwife, Suzie Portela for triage instructions. Patient is being seen at the present time by [facility] in [place]. Patient stated that they are providing an antibiotic and will stay there for care at this time.

## 2021-08-07 NOTE — Patient Instructions (Signed)
    Post Partum Visit Note  Sara Morse is a 26 y.o. G54P2012 female who presents for a postpartum visit. She is 6 weeks postpartum following a repeat cesarean section.  I have fully reviewed the prenatal and intrapartum course. The delivery was at epi gestational weeks.  Anesthesia: epidural. Postpartum course has been ***. Baby is doing well***. Baby is feeding by {breastmilk/bottle:69}. Bleeding {vag bleed:12292}. Bowel function is {normal:32111}. Bladder function is {normal:32111}. Patient {is/is not:9024} sexually active. Contraception method is {contraceptive method:5051}. Postpartum depression screening: {gen negative/positive:315881}.   The pregnancy intention screening data noted above was reviewed. Potential methods of contraception were discussed. The patient elected to proceed with @ENDMETHOD @.    Health Maintenance Due  Topic Date Due   COVID-19 Vaccine (1) Never done   PAP-Cervical Cytology Screening  Never done   PAP SMEAR-Modifier  Never done    {Common ambulatory SmartLinks:19316}  Review of Systems {ros; complete:30496}  Objective:  There were no vitals taken for this visit.   General:  {gen appearance:16600}   Breasts:  {desc; normal/abnormal/not indicated:14647}  Lungs: {lung exam:16931}  Heart:  {heart exam:5510}  Abdomen: {abdomen exam:16834}   Wound {Wound assessment:11097}  GU exam:  {desc; normal/abnormal/not indicated:14647}       Assessment:    There are no diagnoses linked to this encounter.  *** postpartum exam.   Plan:   Essential components of care per ACOG recommendations:  1.  Mood and well being: Patient with {gen negative/positive:315881} depression screening today. Reviewed local resources for support.  - Patient tobacco use? {tobacco use:25506}  - hx of drug use? {yes/no:25505}    2. Infant care and feeding:  -Patient currently breastmilk feeding? {yes/no:25502}  -Social determinants of health (SDOH) reviewed in EPIC. No  concerns***The following needs were identified***  3. Sexuality, contraception and birth spacing - Patient {DOES_DOES ZOX:09604} want a pregnancy in the next year.  Desired family size is {NUMBER 1-10:22536} children.  - Reviewed reproductive life planning. Reviewed contraceptive methods based on pt preferences and effectiveness.  Patient desired {Upstream End Methods:24109} today.   - Discussed birth spacing of 18 months  4. Sleep and fatigue -Encouraged family/partner/community support of 4 hrs of uninterrupted sleep to help with mood and fatigue  5. Physical Recovery  - Discussed patients delivery and complications. She describes her labor as {description:25511} - Patient had a {CHL AMB DELIVERY:[id number]}. Patient had a {laceration:25518} laceration. Perineal healing reviewed. Patient expressed understanding - Patient has urinary incontinence? {yes/no:25515} - Patient {ACTION; IS/IS VWU:98119147} safe to resume physical and sexual activity  6.  Health Maintenance - HM due items addressed {Yes or If no, why not?:20788} - Last pap smear No results found for: "DIAGPAP" Pap smear {done:10129} at today's visit.  -Breast Cancer screening indicated? {indicated:25516}  7. Chronic Disease/Pregnancy Condition follow up: {Follow up:25499}  - PCP follow up  Granville Lewis, RN [facility] [employer], [facility]

## 2021-08-08 ENCOUNTER — Ambulatory Visit (INDEPENDENT_AMBULATORY_CARE_PROVIDER_SITE_OTHER): Payer: Medicaid Other | Admitting: Obstetrics and Gynecology

## 2021-08-08 ENCOUNTER — Encounter: Payer: Self-pay | Admitting: Obstetrics and Gynecology

## 2021-08-08 VITALS — BP 130/82 | HR 89 | Ht 64.0 in | Wt 218.0 lb

## 2021-08-08 DIAGNOSIS — O10919 Unspecified pre-existing hypertension complicating pregnancy, unspecified trimester: Secondary | ICD-10-CM

## 2021-08-08 DIAGNOSIS — Z3009 Encounter for other general counseling and advice on contraception: Secondary | ICD-10-CM

## 2021-08-08 DIAGNOSIS — F419 Anxiety disorder, unspecified: Secondary | ICD-10-CM | POA: Diagnosis not present

## 2021-08-08 DIAGNOSIS — Z9889 Other specified postprocedural states: Secondary | ICD-10-CM

## 2021-08-08 DIAGNOSIS — Z98891 History of uterine scar from previous surgery: Secondary | ICD-10-CM | POA: Diagnosis not present

## 2021-08-08 DIAGNOSIS — T8189XA Other complications of procedures, not elsewhere classified, initial encounter: Secondary | ICD-10-CM

## 2021-08-08 DIAGNOSIS — F32A Depression, unspecified: Secondary | ICD-10-CM

## 2021-08-08 MED ORDER — SLYND 4 MG PO TABS: 4.0000 mg | ORAL_TABLET | Freq: Every day | ORAL | 3 refills | Status: AC

## 2021-08-08 NOTE — Progress Notes (Signed)
Obstetrics/Postpartum Visit  Appointment Date: 08/08/2021  OBGYN Clinic: [facility]  Primary Care Provider: Pcp, No  Chief Complaint:  Chief Complaint  Patient presents with   Postpartum Care    History of Present Illness: Sara Morse is a 26 y.o. Caucasian [id number] (No LMP recorded.), seen for the above chief complaint. Her past medical history is significant for chronic Hypertension   She is s/p repeat c-section on 06/27/21 at 39 weeks; she was discharged to home on POD#2. Pregnancy complicated by chronic hypertension, SROM. Anxiety/depression.  Complains of nothing  Vaginal bleeding or discharge: No  Breast or formula feeding: bottle Intercourse: No  Contraception: OCPs PP depression s/s: No  Any bowel or bladder issues: No  Pap smear:   Review of Systems: Positive for n/a.   Her 12 point review of systems is negative or as noted in the History of Present Illness.  Patient Active Problem List   Diagnosis Date Noted   Acute blood loss anemia 06/28/2021   Status post repeat low transverse cesarean section 06/27/2021   Obesity affecting pregnancy 03/01/2021   History of macrosomia in infant in prior pregnancy, currently pregnant 03/01/2021   Status post cesarean delivery 09/22/2018   Supervision of high risk pregnancy, antepartum 02/16/2018   Chronic hypertension affecting pregnancy 02/16/2018   Anxiety and depression 05/27/2017    Medications Sara Morse had no medications administered during this visit. Current Outpatient Medications  Medication Sig Dispense Refill   Drospirenone (SLYND) 4 MG TABS Take 4 mg by mouth daily. 28 tablet 3   sertraline (ZOLOFT) 100 MG tablet Take 1 tablet (100 mg total) by mouth daily. 30 tablet 2   furosemide (LASIX) 20 MG tablet Take 1 tablet (20 mg total) by mouth daily. (Patient not taking: Reported on 08/08/2021) 3 tablet 0   ibuprofen (ADVIL) 600 MG tablet Take 1 tablet (600 mg total) by mouth every 6 (six) hours. (Patient  not taking: Reported on 08/08/2021) 30 tablet 0   NIFEdipine (ADALAT CC) 30 MG 24 hr tablet Take 1 tablet (30 mg total) by mouth daily. (Patient not taking: Reported on 08/08/2021) 30 tablet 3   No current facility-administered medications for this visit.    Allergies Patient has no known allergies.  Physical Exam:  BP 130/82   Pulse 89   Ht 5\' 4"  (1.626 m)   Wt 218 lb (98.9 kg)   Breastfeeding No   BMI 37.42 kg/m  Body mass index is 37.42 kg/m. General appearance: Well nourished, well developed female in no acute distress.  Cardiovascular: regular rate and rhythm Respiratory:  Normal respiratory effort Abdomen: no masses, hernias; diffusely non tender to palpation, non distended, well approximated pfannenstiel incision with erythema and some induration, possible fungal ? Breasts: not examined. Neuro/Psych:  Normal mood and affect.  Skin:  Warm and dry.    PP Depression Screening:    Edinburgh Postnatal Depression Scale - 08/08/21 0939       Edinburgh Postnatal Depression Scale:  In the Past 7 Days   I have been able to laugh and see the funny side of things. 0    I have looked forward with enjoyment to things. 0    I have blamed myself unnecessarily when things went wrong. 0    I have been anxious or worried for no good reason. 0    I have felt scared or panicky for no good reason. 0    Things have been getting on top of me. 0  I have been so unhappy that I have had difficulty sleeping. 0    I have felt sad or miserable. 0    I have been so unhappy that I have been crying. 0    The thought of harming myself has occurred to me. 0    Edinburgh Postnatal Depression Scale Total 0             Edinburgh Postnatal Depression Scale - 08/08/21 0939       Edinburgh Postnatal Depression Scale:  In the Past 7 Days   I have been able to laugh and see the funny side of things. 0    I have looked forward with enjoyment to things. 0    I have blamed myself unnecessarily when  things went wrong. 0    I have been anxious or worried for no good reason. 0    I have felt scared or panicky for no good reason. 0    Things have been getting on top of me. 0    I have been so unhappy that I have had difficulty sleeping. 0    I have felt sad or miserable. 0    I have been so unhappy that I have been crying. 0    The thought of harming myself has occurred to me. 0    Edinburgh Postnatal Depression Scale Total 0              Assessment: Patient is a 26 y.o. [id number] who is 5 weeks post partum from a repeat c-section. She is doing well.   Plan:   1. Postoperative state  2. Status post cesarean delivery  3. Chronic hypertension affecting pregnancy Stopped meds, normotensive today Cont not meds  4. Anxiety and depression Stable on zoloft Feels mood stable  5. Encounter for counseling regarding contraception Start progesterone only pills today  6. Problem involving surgical incision Erythema and redness at incision, well approximated, possible fungal Instructions to use antifungal daily and return one week to see if improved   Essential components of care per ACOG recommendations:  1.  Mood and well being: Patient with negative depression screening today. Reviewed local resources for support.  - Patient does not use tobacco.  - hx of drug use? No    2. Infant care and feeding:  -Patient currently breastmilk feeding? No  -Social determinants of health (SDOH) reviewed in EPIC. No concerns  3. Sexuality, contraception and birth spacing - Patient does not want a pregnancy in the next year.  Desired family size is 2 children.  - Reviewed forms of contraception in tiered fashion. Patient desired oral progesterone-only contraceptive today.   - Discussed birth spacing of 18 months  4. Sleep and fatigue -Encouraged family/partner/community support of 4 hrs of uninterrupted sleep to help with mood and fatigue  5. Physical Recovery  - Discussed patients  delivery - Patient had a repeat c-section - Patient has urinary incontinence? No  - Patient is safe to resume physical and sexual activity  6.  Health Maintenance - Last pap smear done ?, will return for annual, prefers not to do it today.  7. Chronic hypertension - PCP follow up - has not taken meds in several days, normotensive today - d/c nifepidine   RTC for annual 2-3 months, 1 week for incision check   K. Therese Ranay, MD, [facility] Attending Center for [facility])

## 2021-08-12 NOTE — Progress Notes (Deleted)
   GYNECOLOGY OFFICE VISIT NOTE  History:   Sara Morse is a 26 y.o. [phone] here today for incision check following her c-section - she was seen 7/6 and some redness around the incision, thought likely to be fungal. Antifungal encouraged. Since then pt reports ***    The following portions of the patient's history were reviewed and updated as appropriate: allergies, current medications, past family history, past medical history, past social history, past surgical history and problem list.   Review of Systems:  Pertinent items noted in HPI and remainder of comprehensive ROS otherwise negative.  Physical Exam:  There were no vitals taken for this visit. CONSTITUTIONAL: Well-developed, well-nourished female in no acute distress.  HEENT:  Normocephalic, atraumatic. External right and left ear normal. No scleral icterus.  NECK: Normal range of motion, supple, no masses noted on observation SKIN: No rash noted. Not diaphoretic. No erythema. No pallor. MUSCULOSKELETAL: Normal range of motion. No edema noted. NEUROLOGIC: Alert and oriented to person, place, and time. Normal muscle tone coordination. No cranial nerve deficit noted. PSYCHIATRIC: Normal mood and affect. Normal behavior. Normal judgment and thought content.  ABDOMEN: No masses noted. No other overt distention noted.  Incision ***  PELVIC: Deferred  Labs and Imaging No results found for this or any previous visit (from the past 168 hour(s)). No results found.  Assessment and Plan:   1. Encounter for postoperative wound check ***    Diagnoses and all orders for this visit:  Encounter for postoperative wound check    Routine preventative health maintenance measures emphasized. Please refer to After Visit Summary for other counseling recommendations.   No follow-ups on file.  Milas Hock, MD, FACOG Obstetrician & Gynecologist, [facility] for [facility], [facility]

## 2021-08-15 ENCOUNTER — Ambulatory Visit: Payer: Medicaid Other | Admitting: Obstetrics and Gynecology

## 2021-08-15 DIAGNOSIS — Z4889 Encounter for other specified surgical aftercare: Secondary | ICD-10-CM

## 2021-09-03 ENCOUNTER — Ambulatory Visit: Payer: Medicaid Other | Admitting: Obstetrics and Gynecology

## 2021-09-05 ENCOUNTER — Encounter: Payer: Self-pay | Admitting: Obstetrics and Gynecology

## 2021-09-05 ENCOUNTER — Ambulatory Visit (INDEPENDENT_AMBULATORY_CARE_PROVIDER_SITE_OTHER): Payer: Medicaid Other | Admitting: Obstetrics and Gynecology

## 2021-09-05 ENCOUNTER — Other Ambulatory Visit (HOSPITAL_COMMUNITY)
Admission: RE | Admit: 2021-09-05 | Discharge: 2021-09-05 | Disposition: A | Discharge status: Home / Self Care | Payer: Medicaid Other | Source: Ambulatory Visit | Attending: Obstetrics and Gynecology | Admitting: Obstetrics and Gynecology

## 2021-09-05 VITALS — BP 132/84 | HR 92 | Ht 64.0 in | Wt 222.0 lb

## 2021-09-05 DIAGNOSIS — T8189XA Other complications of procedures, not elsewhere classified, initial encounter: Secondary | ICD-10-CM | POA: Insufficient documentation

## 2021-09-05 MED ORDER — NYSTATIN 100000 UNIT/GM EX POWD: 1.0000 | Freq: Three times a day (TID) | CUTANEOUS | 0 refills | Status: AC

## 2021-09-05 NOTE — Progress Notes (Signed)
   GYNECOLOGY OFFICE VISIT NOTE  History:   Sara Morse is a 26 y.o. L3Y1017 here today for incision check.   She had her c-section in May 2023.  She has been doing aquaphor and it seems to help.   She denies any abnormal vaginal discharge, bleeding, pelvic pain or other concerns.     Past Medical History:  Diagnosis Date   Anxiety    Hypertension    Mental disorder     Past Surgical History:  Procedure Laterality Date   CESAREAN SECTION N/A 09/20/2018   Procedure: CESAREAN SECTION;  Surgeon: Conan Bowens, MD;  Location: [facility] LD ORS;  Service: Obstetrics;  Laterality: N/A;   CESAREAN SECTION N/A 06/27/2021   Procedure: CESAREAN SECTION;  Surgeon: Reva Bores, MD;  Location: [facility] LD ORS;  Service: Obstetrics;  Laterality: N/A;   DENTAL SURGERY      The following portions of the patient's history were reviewed and updated as appropriate: allergies, current medications, past family history, past medical history, past social history, past surgical history and problem list.   Review of Systems:  Pertinent items noted in HPI and remainder of comprehensive ROS otherwise negative.  Physical Exam:  BP 132/84   Pulse 92   Ht 5\' 4"  (1.626 m)   Wt 222 lb (100.7 kg)   Breastfeeding No   BMI 38.11 kg/m  CONSTITUTIONAL: Well-developed, well-nourished female in no acute distress.  HEENT:  Normocephalic, atraumatic. External right and left ear normal. No scleral icterus.  NECK: Normal range of motion, supple, no masses noted on observation SKIN: No rash noted. Not diaphoretic. No erythema. No pallor. MUSCULOSKELETAL: Normal range of motion. No edema noted. NEUROLOGIC: Alert and oriented to person, place, and time. Normal muscle tone coordination. No cranial nerve deficit noted. PSYCHIATRIC: Normal mood and affect. Normal behavior. Normal judgment and thought content.  CARDIOVASCULAR: Normal heart rate noted RESPIRATORY: Effort and breath sounds normal, no problems with respiration  noted ABDOMEN: No masses noted. No other overt distention noted.  Incision inspected and completely intact. Possible yeast reaction with some redness and white collection around that area.   Labs and Imaging No results found for this or any previous visit (from the past 168 hour(s)). No results found.  Assessment and Plan:  Sara Morse was seen today for wound check.  Diagnoses and all orders for this visit:  Problem involving surgical incision -     nystatin (MYCOSTATIN/NYSTOP) powder; Apply 1 Application topically 3 (three) times daily. -     Cervicovaginal ancillary only( [place])  - Suspect some yeast. Will do culture and nystatin powder. Encouraged to keep it dry for next 2 weeks and should help complete the healing.   Routine preventative health maintenance measures emphasized. Please refer to After Visit Summary for other counseling recommendations.   No follow-ups on file.  [name], MD, FACOG Obstetrician & Gynecologist, [facility] for [facility], [facility]

## 2021-09-06 LAB — CERVICOVAGINAL ANCILLARY ONLY
Candida Glabrata: NEGATIVE
Candida Vaginitis: NEGATIVE
Comment: NEGATIVE
Comment: NEGATIVE

## 2023-04-09 IMAGING — US US MFM OB FOLLOW-UP
1 series · 12 of 28 positions shown · non-contrast
Comparison: none

[Series 1: us mfm ob follow-up · 12 of 108 slices shown]
[im 4/108]
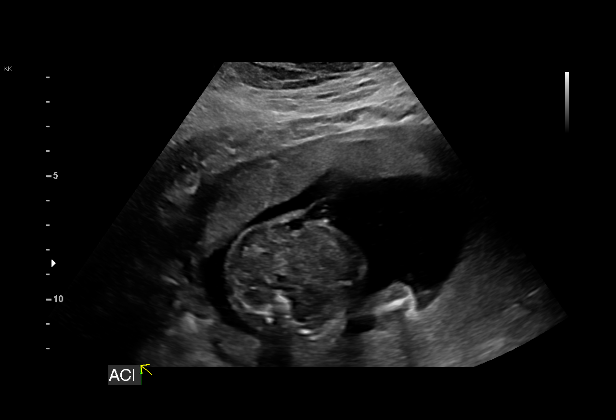
[im 12/108]
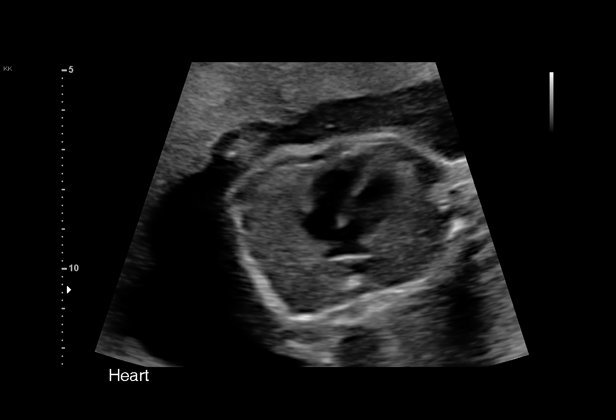
[im 20/108]
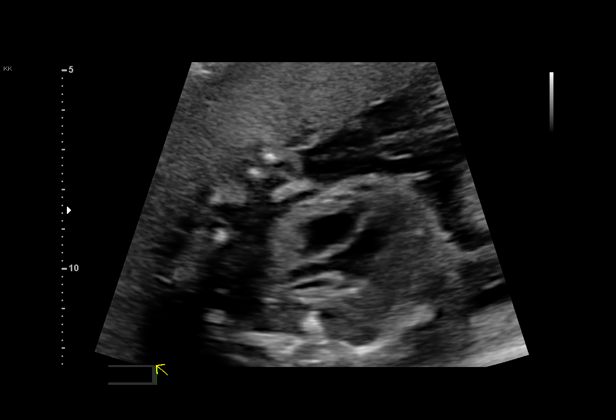
[im 32/108]
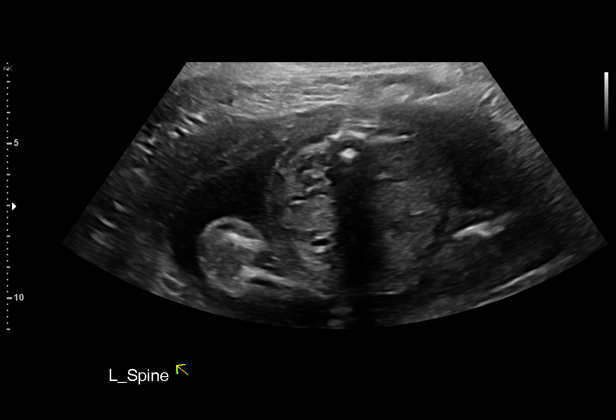
[im 40/108]
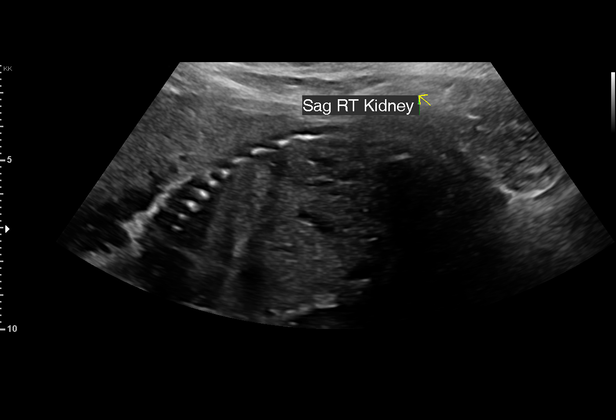
[im 48/108]
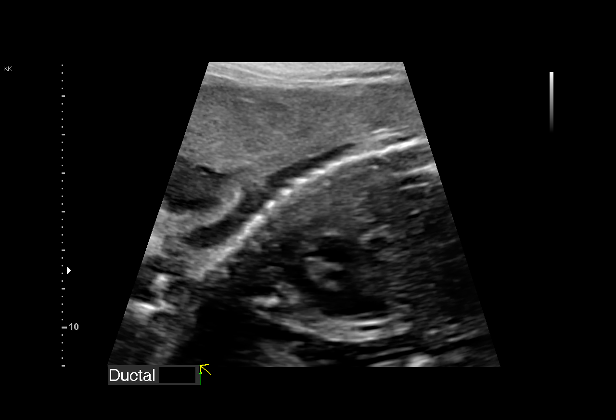
[im 60/108]
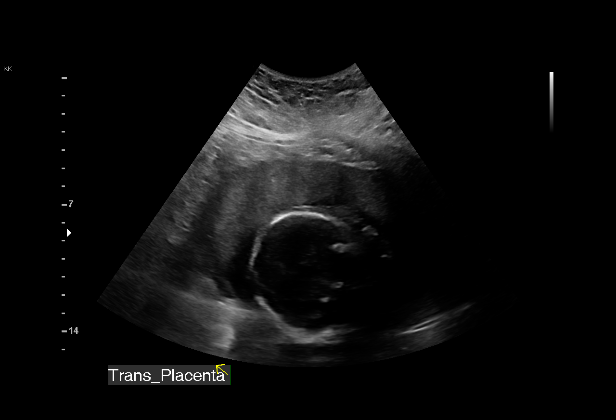
[im 68/108]
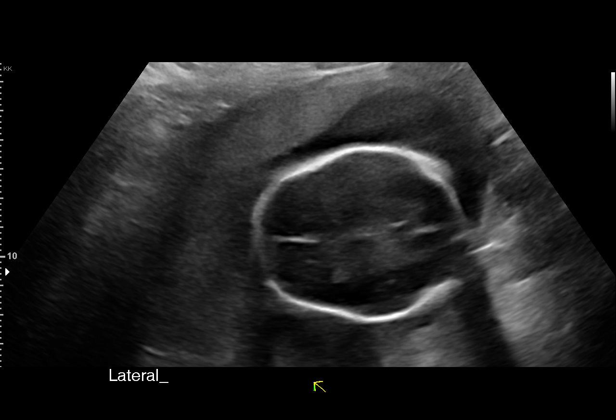
[im 76/108]
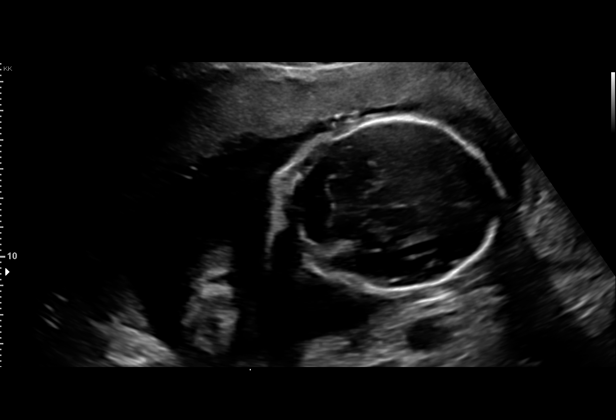
[im 88/108]
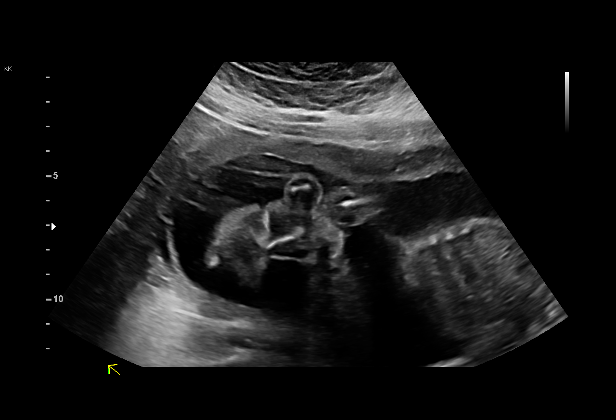
[im 96/108]
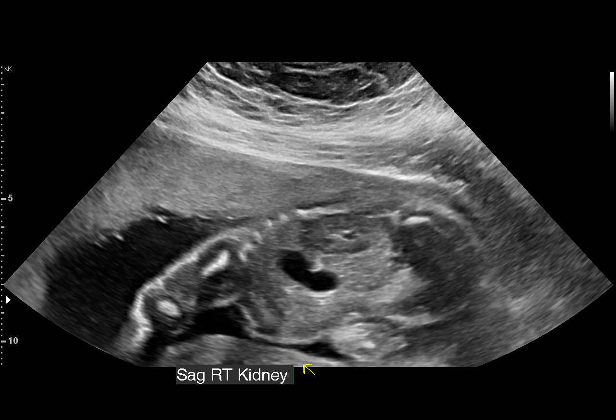
[im 104/108]
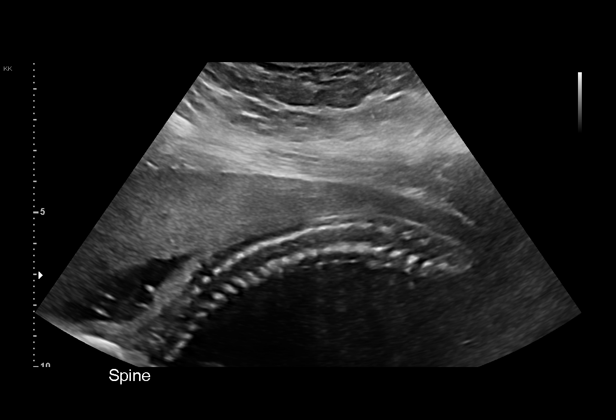

[12 of 28 positions shown; findings below may reference images not displayed]

Addendum:\.br----------------------------------------------------------------------
----------------------------------------------------------------------

----------------------------------------------------------------------

 Ref. Address:     [id number] [HOSPITAL]
                   [HOSPITAL][HOSPITAL]
----------------------------------------------------------------------

----------------------------------------------------------------------

----------------------------------------------------------------------
Indications

 Hypertension - Chronic/Pre-existing
 (Labetalol)
 Obesity complicating pregnancy, second
 trimester (BMI 39)
 23 weeks gestation of pregnancy
 History of cesarean delivery, currently
 pregnant
 Poor obstetric history: Prior fetal
 macrosomia, antepartum
----------------------------------------------------------------------
Fetal Evaluation

 Num Of Fetuses:         1
 Fetal Heart Rate(bpm):  158
 Cardiac Activity:       Observed
 Presentation:           Breech
 Placenta:               Anterior
 P. Cord Insertion:      Previously Visualized

 Amniotic Fluid
 AFI FV:      Within normal limits

                             Largest Pocket(cm)

----------------------------------------------------------------------
Biometry
 BPD:      59.2  mm     G. Age:  24w 1d         56  %    CI:         77.1   %    70 - 86
                                                         FL/HC:      18.4   %    18.7 -
 HC:      213.5  mm     G. Age:  23w 3d         18  %    HC/AC:      1.09        1.05 -
 AC:      195.7  mm     G. Age:  24w 2d         54  %    FL/BPD:     66.4   %    71 - 87
 FL:       39.3  mm     G. Age:  22w 5d          8  %    FL/AC:      20.1   %    20 - 24
 CER:      23.4  mm     G. Age:  21w 5d         13  %

 CM:       12.1  mm

 Est. FW:     608  gm      1 lb 5 oz     29  %
----------------------------------------------------------------------
OB History

 Blood Type:   O+
 Gravidity:    3         Term:   1        Prem:   0        SAB:   0
 TOP:          0       Ectopic:  0        Living: 1
----------------------------------------------------------------------
Gestational Age

 LMP:           23w 6d        Date:  09/27/20                 EDD:   07/04/21
 U/S Today:     23w 5d                                        EDD:   07/05/21
 Best:          23w 6d     Det. By:  LMP  (09/27/20)          EDD:   07/04/21
----------------------------------------------------------------------
Anatomy

 Cranium:               Appears normal         LVOT:                   Appears normal
 Cavum:                 Appears normal         Aortic Arch:            Appears normal
 Ventricles:            Appears normal         Ductal Arch:            Appears normal
 Choroid Plexus:        Appears normal         Diaphragm:              Appears normal
 Cerebellum:            Appears normal         Stomach:                Appears normal, left
                                                                       sided
 Posterior Fossa:       Appears normal         Abdomen:                Appears normal
 Nuchal Fold:           Appears normal         Abdominal Wall:         Appears nml (cord
                                                                       insert, abd wall)
 Face:                  Appears normal         Cord Vessels:           Previously seen
                        (orbits and profile)
 Lips:                  Appears normal         Kidneys:                Appear normal
 Palate:                Not well visualized    Bladder:                Appears normal
 Thoracic:              Appears normal         Spine:                  Appears normal
 Heart:                 Appears normal         Upper Extremities:      Previously seen
                        (4CH, axis, and
                        situs)
 RVOT:                  Appears normal         Lower Extremities:      Previously seen

 Other:  Parents do not wish to know sex of fetus at this time. Technically
         difficult due to maternal habitus and fetal position.
----------------------------------------------------------------------
Cervix Uterus Adnexa

 Cervix
 Length:           3.85  cm.
 Normal appearance by transabdominal scan.
----------------------------------------------------------------------
Impression

 MFM Brief Note

 Ms. Ramadas is a G3P1 who is here for follow up follow up
 growth and anatomy.

 Her pregnancy is complicated by an elevated BMI, chronic
 hypertension (recently started on Labetalol).

 She is seen at the request of Dr. Preluna Ahlman.

 Normal interval growth with measurements consistent with
 dates
 Good fetal movement and amniotic fluid volume

 Ms. Ceola was recently prescribed labetalol 200 mg BID
 for blood pressure management. Her blood pressure was
 133/58 mmHg
 We discussed the increased risk for preeclampsia, placental
 abruption and fetal growth restriction in women with chronic
 hypertension. She is taking low dose ASA for preeclampsia
 prevention.
 I explained that serial growth exams should be performed
 every 4 weeks throughout the pregnancy. Medical therapy
 should be increased if blood pressure persist >150/411's,
 with the goal of an average BP of 135/85 mmHg.

 Given the requirement of medical therapy  we recommend
 weekly testing at 32 weeks.

 She is aware of the s/sx or preeclampsia including headache,
 vision changes and right upper quadrant pain.

 Baseline labs of UPC, CBC, and CMP were performed on
 and normal.

 Secondly, today we observed as in the previous exam a
 prominent cysterna magna of 8 mm. I explained the normal
 nature of today's exam but recommend reassessment at
 future growth exam.

 I reviewed the etiology of a large cysterna magna or also
 known as a mega cysterna defined by >10 mm. There are no
 markers of aneuploidy or additional intracranial findings.

 I explained the differential diagnosis included a normal
 finding, dandy walker variant, Delaney Canto cyst, arachnoid
 cyst and vermian agenesis or hypoplasia.

 Ms. Ceola had originally declined genetic screening,
 however, given today's finding she desired non-invasive cell
 free DNA today.

 I have scheduled Ms. Ceola to return in 4 weeks for
 growth and to reassess the posterior fossa.

 I spent 30 minutes with > 50% in face to face consultation.
----------------------------------------------------------------------
Recommendations

 Follow up growth is scheduled in 4-6 weeks given her chronic
 hypertension requiring medications.
----------------------------------------------------------------------
----------------------------------------------------------------------

*** End of Addendum ***\.br----------------------------------------------------------------------
----------------------------------------------------------------------

----------------------------------------------------------------------

 Ref. Address:     [id number] [HOSPITAL]
                   [HOSPITAL][HOSPITAL]
----------------------------------------------------------------------

----------------------------------------------------------------------

----------------------------------------------------------------------
Indications

 Hypertension - Chronic/Pre-existing
 (Labetalol)
 Obesity complicating pregnancy, second
 trimester (BMI 39)
 23 weeks gestation of pregnancy
 History of cesarean delivery, currently
 pregnant
 Poor obstetric history: Prior fetal
 macrosomia, antepartum
----------------------------------------------------------------------
Fetal Evaluation

 Num Of Fetuses:         1
 Fetal Heart Rate(bpm):  158
 Cardiac Activity:       Observed
 Presentation:           Breech
 Placenta:               Anterior
 P. Cord Insertion:      Previously Visualized

 Amniotic Fluid
 AFI FV:      Within normal limits

                             Largest Pocket(cm)

----------------------------------------------------------------------
Biometry
 BPD:      59.2  mm     G. Age:  24w 1d         56  %    CI:         77.1   %    70 - 86
                                                         FL/HC:      18.4   %    18.7 -
 HC:      213.5  mm     G. Age:  23w 3d         18  %    HC/AC:      1.09        1.05 -
 AC:      195.7  mm     G. Age:  24w 2d         54  %    FL/BPD:     66.4   %    71 - 87
 FL:       39.3  mm     G. Age:  22w 5d          8  %    FL/AC:      20.1   %    20 - 24
 CER:      23.4  mm     G. Age:  21w 5d         13  %

 CM:       12.1  mm

 Est. FW:     608  gm      1 lb 5 oz     29  %
----------------------------------------------------------------------
OB History

 Blood Type:   O+
 Gravidity:    3         Term:   1        Prem:   0        SAB:   0
 TOP:          0       Ectopic:  0        Living: 1
----------------------------------------------------------------------
Gestational Age

 LMP:           23w 6d        Date:  09/27/20                 EDD:   07/04/21
 U/S Today:     23w 5d                                        EDD:   07/05/21
 Best:          23w 6d     Det. By:  LMP  (09/27/20)          EDD:   07/04/21
----------------------------------------------------------------------
Anatomy

 Cranium:               Appears normal         LVOT:                   Appears normal
 Cavum:                 Appears normal         Aortic Arch:            Appears normal
 Ventricles:            Appears normal         Ductal Arch:            Appears normal
 Choroid Plexus:        Appears normal         Diaphragm:              Appears normal
 Cerebellum:            Appears normal         Stomach:                Appears normal, left
                                                                       sided
 Posterior Fossa:       Appears normal         Abdomen:                Appears normal
 Nuchal Fold:           Appears normal         Abdominal Wall:         Appears nml (cord
                                                                       insert, abd wall)
 Face:                  Appears normal         Cord Vessels:           Previously seen
                        (orbits and profile)
 Lips:                  Appears normal         Kidneys:                Appear normal
 Palate:                Not well visualized    Bladder:                Appears normal
 Thoracic:              Appears normal         Spine:                  Appears normal
 Heart:                 Appears normal         Upper Extremities:      Previously seen
                        (4CH, axis, and
                        situs)
 RVOT:                  Appears normal         Lower Extremities:      Previously seen

 Other:  Parents do not wish to know sex of fetus at this time. Technically
         difficult due to maternal habitus and fetal position.
----------------------------------------------------------------------
Cervix Uterus Adnexa

 Cervix
 Length:           3.85  cm.
 Normal appearance by transabdominal scan.
----------------------------------------------------------------------
Impression

 Follow up growth due to elevated BMI, chronic hypertension
 and suboptimal anatomy seen at the previous exam.
 Normal interval growth with measurements consistent with
 dates
 Good fetal movement and amniotic fluid volume

 Ms. Ceola is taking labetalol 200 mg BID for blood
 pressure management. Her blood pressure was 133/58
 mmHg

 Today we observed as in the previous exam a prominent
 cysterna magna of 8 mm. I explained the normal nature of
 today's exam but recommend reassessment at future growth
 exam.
----------------------------------------------------------------------
Recommendations

 Follow up growth is scheduled in 4-6 weeks given her chronic
 hypertension requiring medications.
----------------------------------------------------------------------
----------------------------------------------------------------------

## 2023-07-14 IMAGING — US US MFM OB FOLLOW-UP
1 series · 14 of 28 positions shown · non-contrast
Comparison: none

[Series 1: us mfm ob follow-up · 58 acquisitions, 14 frames shown]
[im 3/58]
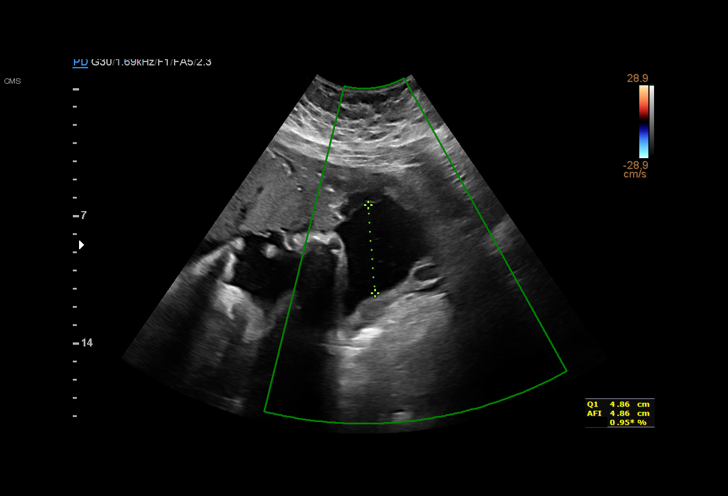
[im 7/58]
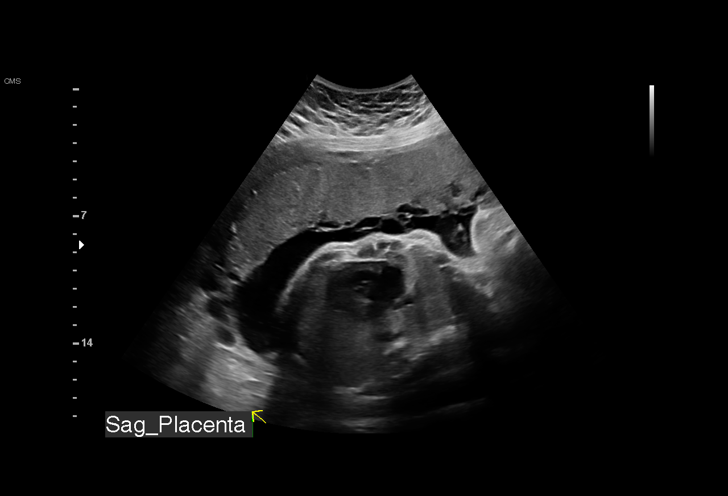
[im 11/58]
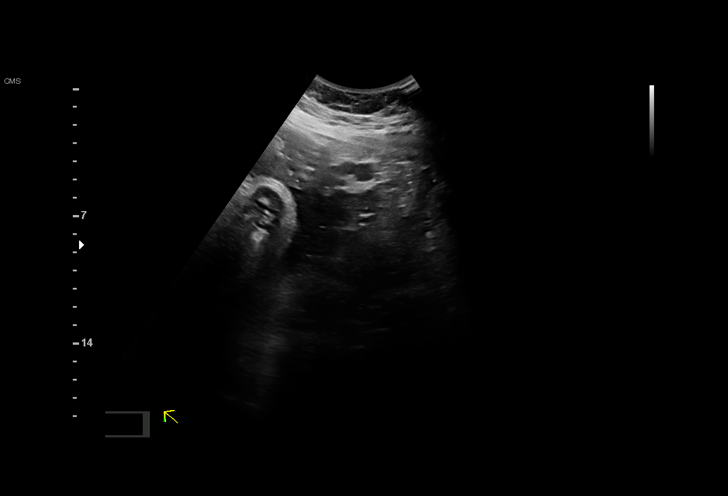
[im 15/58]
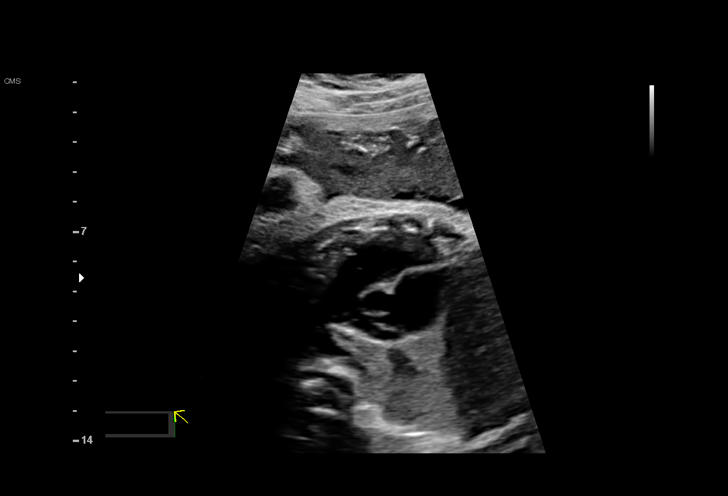
[im 20/58]
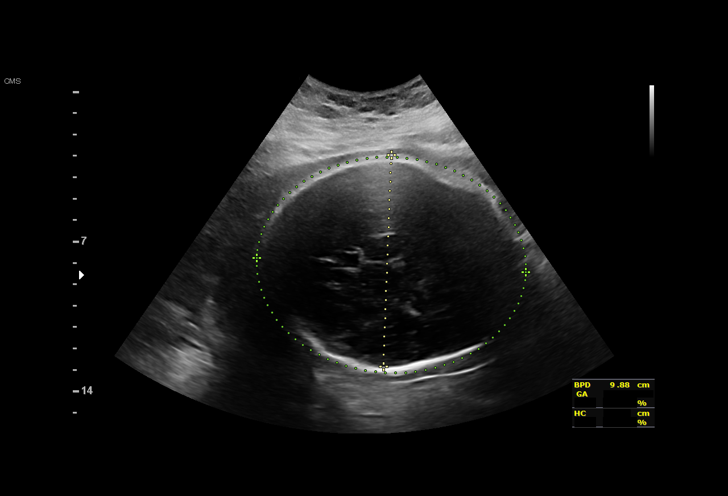
[im 24/58]
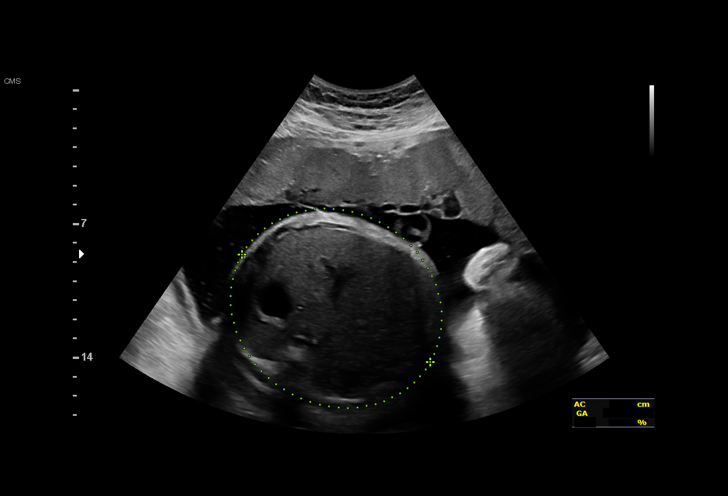
[im 28/58]
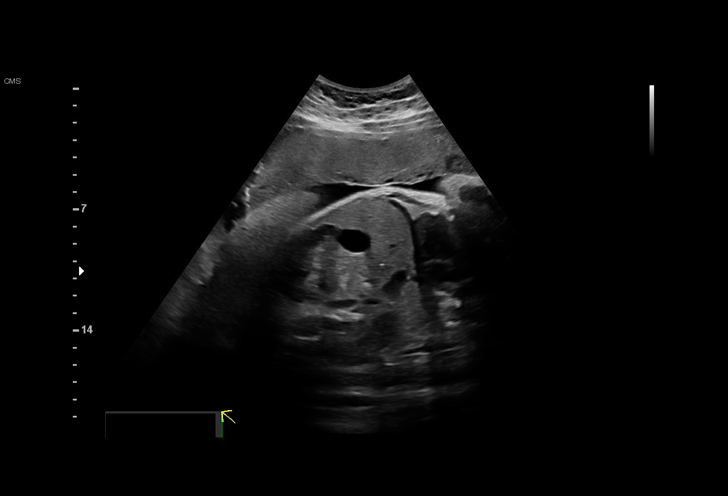
[im 32/58]
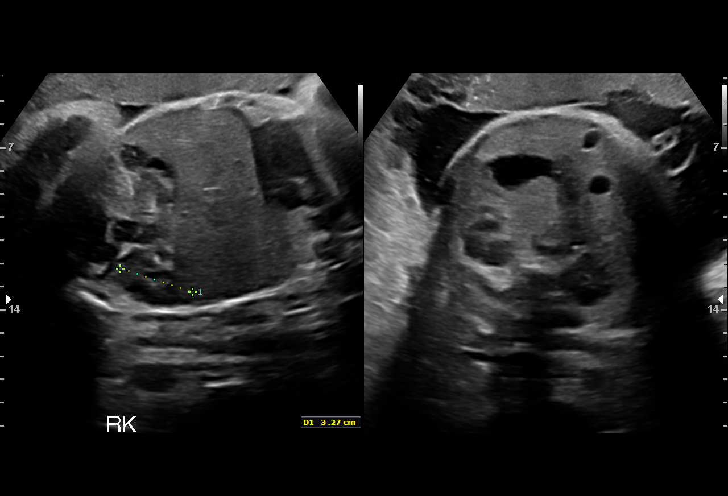
[im 36/58]
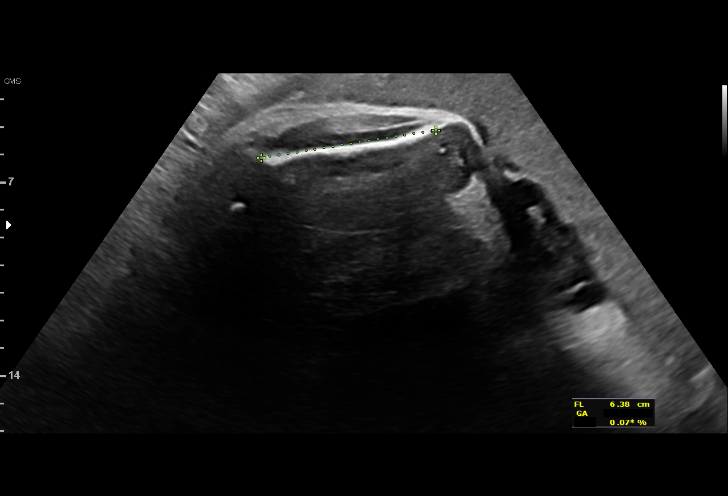
[im 41/58]
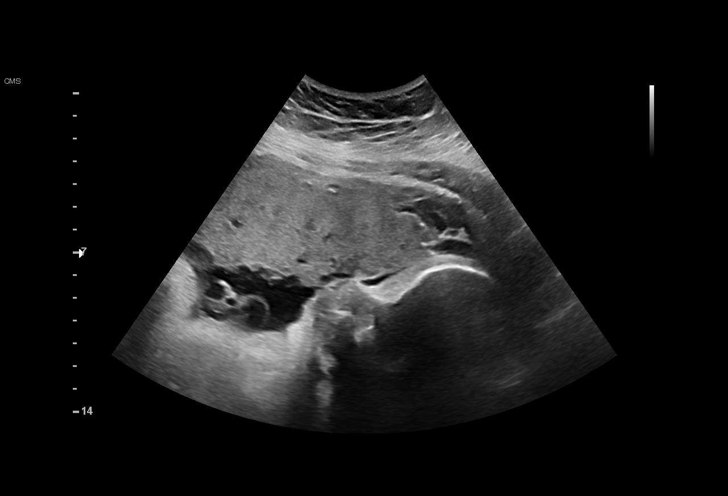
[im 45/58]
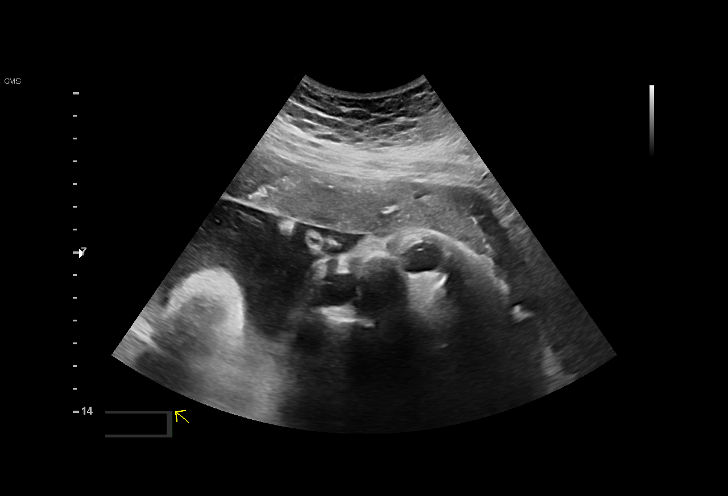
[im 49/58]
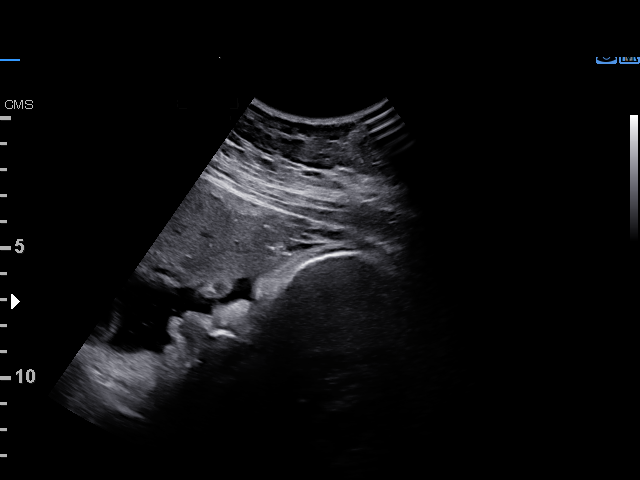
[im 53/58]
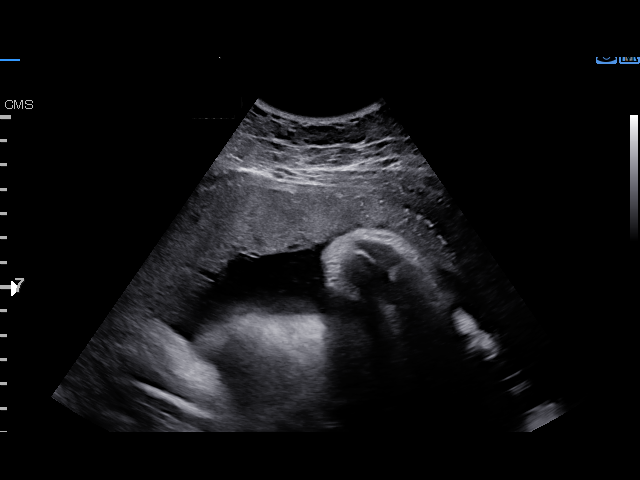
[im 58/58]
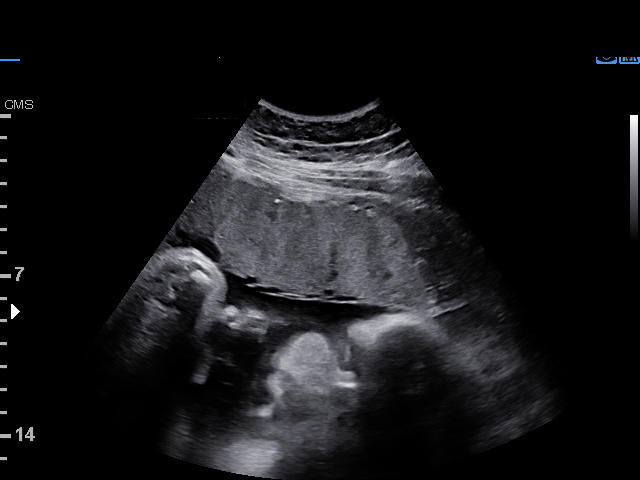

[14 of 28 positions shown; findings below may reference images not displayed]

Indications

 Hypertension - Chronic/Pre-existing
 (Labetalol)
 Obesity complicating pregnancy, third
 trimester
 37 weeks gestation of pregnancy
 History of cesarean delivery, currently
 pregnant
 Poor obstetric history: Prior fetal
 macrosomia, antepartum
Fetal Evaluation

 Num Of Fetuses:         1
 Fetal Heart Rate(bpm):  160
 Cardiac Activity:       Observed
 Presentation:           Cephalic
 Placenta:               Anterior
 P. Cord Insertion:      Previously Visualized

 Amniotic Fluid
 AFI FV:      Within normal limits

 AFI Sum(cm)     %Tile       Largest Pocket(cm)
 17.2            66

 RUQ(cm)       RLQ(cm)       LUQ(cm)        LLQ(cm)

Biophysical Evaluation

 Amniotic F.V:   Within normal limits       F. Tone:        Observed
 F. Movement:    Observed                   Score:          [DATE]
 F. Breathing:   Observed
Biometry

 BPD:      97.8  mm     G. Age:  40w 0d         99  %    CI:        75.43   %    70 - 86
                                                         FL/HC:      17.8   %    20.9 -
 HC:      357.1  mm     G. Age:  41w 6d         98  %    HC/AC:      1.05        0.92 -
 AC:      340.5  mm     G. Age:  38w 0d         76  %    FL/BPD:     64.9   %    71 - 87
 FL:       63.5  mm     G. Age:  32w 6d        < 1  %    FL/AC:      18.6   %    20 - 24
 HUM:      56.6  mm     G. Age:  32w 6d        < 5  %
 LV:        3.2  mm

 Est. FW:    5609  gm      7 lb 1 oz     55  %
OB History

 Blood Type:   O+
 Gravidity:    3         Term:   1        Prem:   0        SAB:   0
 TOP:          0       Ectopic:  0        Living: 1
Gestational Age

 LMP:           37w 4d        Date:  09/27/20                 EDD:   07/04/21
 U/S Today:     38w 1d                                        EDD:   06/30/21
 Best:          37w 4d     Det. By:  LMP  (09/27/20)          EDD:   07/04/21
Anatomy

 Cranium:               Appears normal         Aortic Arch:            Previously seen
 Cavum:                 Appears normal         Ductal Arch:            Previously seen
 Ventricles:            Appears normal         Diaphragm:              Appears normal
 Choroid Plexus:        Previously seen        Stomach:                Appears normal, left
                                                                       sided
 Cerebellum:            Previously seen        Abdomen:                Previously seen
 Posterior Fossa:       Previously seen        Abdominal Wall:         Previously seen
 Nuchal Fold:           Previously seen        Cord Vessels:           Previously seen
 Face:                  Appears normal         Kidneys:                Appear normal
                        (orbits and profile)
 Lips:                  Appears normal         Bladder:                Appears normal
 Thoracic:              Previously seen        Spine:                  Previously seen
 Heart:                 Appears normal         Upper Extremities:      Previously seen
                        (4CH, axis, and
                        situs)
 RVOT:                  Appears normal         Lower Extremities:      Previously seen
 LVOT:                  Appears normal

 Other:  Nasal bone and lenses visualized. Technically difficult due to
         maternal habitus and ROGEL.
Cervix Uterus Adnexa
 Cervix
 Not visualized (advanced GA >83wks)

 Uterus
 No abnormality visualized.

 Right Ovary
 Not visualized.

 Left Ovary
 Not visualized.

 Cul De Sac
 No free fluid seen.

 Adnexa
 No abnormality visualized.
Comments

 This patient was seen for a BPP and growth scan due to
 maternal obesity with a BMI of 40 and chronic hypertension
 treated with labetalol 200 mg twice a day.  She denies any
 problems since her last exam.
 The fetal growth and amniotic fluid level appeared
 appropriate for her gestational age.
 A BPP performed today was [DATE].
 Another BPP was scheduled in 1 week.
 She is already scheduled for induction of labor on [DATE],
